# Patient Record
Sex: Male | Born: 1935 | Race: White | Hispanic: No | Smoking: Former smoker
Health system: Southern US, Community
[De-identification: ages and names within clinical notes are randomized; demographics above are authoritative.]

## PROBLEM LIST (undated history)

## (undated) DIAGNOSIS — G20 Parkinson's disease: Secondary | ICD-10-CM

## (undated) DIAGNOSIS — I10 Essential (primary) hypertension: Secondary | ICD-10-CM

## (undated) DIAGNOSIS — R482 Apraxia: Secondary | ICD-10-CM

## (undated) DIAGNOSIS — F0280 Dementia in other diseases classified elsewhere without behavioral disturbance: Secondary | ICD-10-CM

## (undated) DIAGNOSIS — G20A1 Parkinson's disease without dyskinesia, without mention of fluctuations: Secondary | ICD-10-CM

## (undated) DIAGNOSIS — G3183 Dementia with Lewy bodies: Secondary | ICD-10-CM

## (undated) DIAGNOSIS — G309 Alzheimer's disease, unspecified: Secondary | ICD-10-CM

## (undated) HISTORY — DX: Alzheimer's disease, unspecified: G30.9

## (undated) HISTORY — DX: Dementia with Lewy bodies: G31.83

## (undated) HISTORY — DX: Dementia in other diseases classified elsewhere, unspecified severity, without behavioral disturbance, psychotic disturbance, mood disturbance, and anxiety: F02.80

## (undated) HISTORY — DX: Apraxia: R48.2

---

## 2005-01-04 ENCOUNTER — Other Ambulatory Visit: Payer: Self-pay

## 2005-01-10 ENCOUNTER — Ambulatory Visit: Payer: Self-pay | Admitting: Otolaryngology

## 2005-10-22 ENCOUNTER — Ambulatory Visit: Payer: Self-pay | Admitting: Specialist

## 2006-04-09 ENCOUNTER — Other Ambulatory Visit: Payer: Self-pay

## 2006-04-09 ENCOUNTER — Ambulatory Visit: Payer: Self-pay | Admitting: Surgery

## 2006-04-16 ENCOUNTER — Ambulatory Visit: Payer: Self-pay | Admitting: Surgery

## 2007-04-07 ENCOUNTER — Ambulatory Visit: Payer: Self-pay | Admitting: Gastroenterology

## 2011-07-19 ENCOUNTER — Ambulatory Visit: Payer: Self-pay | Admitting: Internal Medicine

## 2012-06-14 DIAGNOSIS — F329 Major depressive disorder, single episode, unspecified: Secondary | ICD-10-CM | POA: Insufficient documentation

## 2012-06-14 DIAGNOSIS — R413 Other amnesia: Secondary | ICD-10-CM | POA: Insufficient documentation

## 2012-10-13 DIAGNOSIS — G250 Essential tremor: Secondary | ICD-10-CM | POA: Insufficient documentation

## 2013-11-09 ENCOUNTER — Inpatient Hospital Stay: Payer: Self-pay | Admitting: Internal Medicine

## 2013-11-09 LAB — URINALYSIS, COMPLETE
Bacteria: NONE SEEN
Glucose,UR: NEGATIVE mg/dL (ref 0–75)
Ketone: NEGATIVE
Leukocyte Esterase: NEGATIVE
Ph: 5 (ref 4.5–8.0)
Protein: 30
RBC,UR: 1 /HPF (ref 0–5)
Specific Gravity: 1.018 (ref 1.003–1.030)
Squamous Epithelial: <1
WBC UR: 2 /HPF (ref 0–5)

## 2013-11-09 LAB — MAGNESIUM: Magnesium: 1.7 mg/dL — ABNORMAL LOW

## 2013-11-09 LAB — CBC
HCT: 40.5 % (ref 40.0–52.0)
MCH: 33.2 pg (ref 26.0–34.0)
MCV: 100 fL (ref 80–100)
RBC: 4.07 10*6/uL — ABNORMAL LOW (ref 4.40–5.90)
RDW: 12.6 % (ref 11.5–14.5)
WBC: 5.5 10*3/uL (ref 3.8–10.6)

## 2013-11-09 LAB — COMPREHENSIVE METABOLIC PANEL
Alkaline Phosphatase: 68 U/L
Chloride: 104 mmol/L (ref 98–107)
Co2: 28 mmol/L (ref 21–32)
EGFR (African American): >60
EGFR (Non-African Amer.): >60
Glucose: 137 mg/dL — ABNORMAL HIGH (ref 65–99)
Osmolality: 277 (ref 275–301)
SGOT(AST): 24 U/L (ref 15–37)
SGPT (ALT): 19 U/L (ref 12–78)
Sodium: 137 mmol/L (ref 136–145)

## 2013-11-10 LAB — TSH: Thyroid Stimulating Horm: 0.199 u[IU]/mL — ABNORMAL LOW

## 2013-11-10 LAB — CBC WITH DIFFERENTIAL/PLATELET
Basophil #: 0 10*3/uL (ref 0.0–0.1)
Eosinophil #: 0 10*3/uL (ref 0.0–0.7)
Eosinophil %: 0 %
HGB: 12.3 g/dL — ABNORMAL LOW (ref 13.0–18.0)
MCH: 32.7 pg (ref 26.0–34.0)
MCHC: 33.2 g/dL (ref 32.0–36.0)
MCV: 99 fL (ref 80–100)
Monocyte #: 0.7 x10 3/mm (ref 0.2–1.0)
Neutrophil #: 6.4 10*3/uL (ref 1.4–6.5)
Neutrophil %: 77.9 %
Platelet: 102 10*3/uL — ABNORMAL LOW (ref 150–440)
RBC: 3.77 10*6/uL — ABNORMAL LOW (ref 4.40–5.90)

## 2013-11-10 LAB — COMPREHENSIVE METABOLIC PANEL
Alkaline Phosphatase: 59 U/L
BUN: 14 mg/dL (ref 7–18)
Calcium, Total: 7.9 mg/dL — ABNORMAL LOW (ref 8.5–10.1)
Chloride: 103 mmol/L (ref 98–107)
Creatinine: 0.81 mg/dL (ref 0.60–1.30)
Glucose: 145 mg/dL — ABNORMAL HIGH (ref 65–99)
Osmolality: 277 (ref 275–301)
Potassium: 3 mmol/L — ABNORMAL LOW (ref 3.5–5.1)
Sodium: 137 mmol/L (ref 136–145)
Total Protein: 6 g/dL — ABNORMAL LOW (ref 6.4–8.2)

## 2013-11-10 LAB — RAPID INFLUENZA A&B ANTIGENS

## 2013-11-10 LAB — HEMOGLOBIN A1C: Hemoglobin A1C: 6.7 % — ABNORMAL HIGH (ref 4.2–6.3)

## 2013-11-10 LAB — MAGNESIUM: Magnesium: 1.7 mg/dL — ABNORMAL LOW

## 2013-11-11 LAB — CLOSTRIDIUM DIFFICILE(ARMC)

## 2013-11-11 LAB — MAGNESIUM: Magnesium: 1.9 mg/dL

## 2013-11-12 LAB — WBCS, STOOL

## 2013-11-13 LAB — WBC: WBC: 3.4 x10 3/mm 3 — ABNORMAL LOW

## 2013-11-13 LAB — HEMOGLOBIN: HGB: 13.8 g/dL (ref 13.0–18.0)

## 2013-11-13 LAB — T4, FREE: Free Thyroxine: 1.23 ng/dL (ref 0.76–1.46)

## 2013-11-14 LAB — CBC WITH DIFFERENTIAL/PLATELET
Basophil #: 0 10*3/uL (ref 0.0–0.1)
Eosinophil #: 0 10*3/uL (ref 0.0–0.7)
Eosinophil %: 0.3 %
HGB: 13.6 g/dL (ref 13.0–18.0)
Lymphocyte #: 1.8 10*3/uL (ref 1.0–3.6)
Lymphocyte %: 39.8 %
MCH: 34.1 pg — ABNORMAL HIGH (ref 26.0–34.0)
MCHC: 34.6 g/dL (ref 32.0–36.0)
Monocyte %: 15.6 %
Neutrophil #: 2 10*3/uL (ref 1.4–6.5)
RBC: 4 10*6/uL — ABNORMAL LOW (ref 4.40–5.90)
WBC: 4.5 10*3/uL (ref 3.8–10.6)

## 2013-11-14 LAB — POTASSIUM: Potassium: 3.5 mmol/L (ref 3.5–5.1)

## 2013-11-14 LAB — CREATININE, SERUM: Creatinine: 0.67 mg/dL (ref 0.60–1.30)

## 2013-11-14 LAB — MAGNESIUM: Magnesium: 2 mg/dL

## 2013-11-14 LAB — CULTURE, BLOOD (SINGLE)

## 2014-02-10 DIAGNOSIS — Z8679 Personal history of other diseases of the circulatory system: Secondary | ICD-10-CM | POA: Insufficient documentation

## 2014-02-10 DIAGNOSIS — I10 Essential (primary) hypertension: Secondary | ICD-10-CM | POA: Insufficient documentation

## 2014-02-10 DIAGNOSIS — Z8673 Personal history of transient ischemic attack (TIA), and cerebral infarction without residual deficits: Secondary | ICD-10-CM | POA: Insufficient documentation

## 2015-02-09 ENCOUNTER — Ambulatory Visit: Payer: Medicare PPO | Attending: Neurology

## 2015-02-09 ENCOUNTER — Encounter: Payer: Self-pay | Admitting: *Deleted

## 2015-02-09 DIAGNOSIS — R293 Abnormal posture: Secondary | ICD-10-CM

## 2015-02-09 DIAGNOSIS — R482 Apraxia: Secondary | ICD-10-CM

## 2015-02-09 DIAGNOSIS — F0280 Dementia in other diseases classified elsewhere without behavioral disturbance: Secondary | ICD-10-CM | POA: Insufficient documentation

## 2015-02-09 DIAGNOSIS — R269 Unspecified abnormalities of gait and mobility: Secondary | ICD-10-CM | POA: Diagnosis not present

## 2015-02-09 DIAGNOSIS — G3183 Dementia with Lewy bodies: Secondary | ICD-10-CM

## 2015-02-09 DIAGNOSIS — G309 Alzheimer's disease, unspecified: Secondary | ICD-10-CM

## 2015-02-09 NOTE — Patient Instructions (Signed)
Educated pt and daughter on plan of care and discussed change of venue due to proximity to [facility]

## 2015-02-09 NOTE — Therapy (Signed)
[facility] [address] [place], [place], [address] Phone: [phone]   Fax:  [phone]  Physical Therapy Evaluation  Patient Details  Name: Jonathan Bean MRN: [id number] Date of Birth: [birth year] Referring Provider:  Gwendolyn Fill, PA  Encounter Date: 02/09/2015      PT End of Session - 02/09/15 1212    Visit Number 1   Number of Visits 17   Date for PT Re-Evaluation 04/07/15   Authorization Type [employer] medicare   Authorization Time Period 3/34/16-04/07/15   PT Start Time 1103   PT Stop Time 1155   PT Time Calculation (min) 52 min   Activity Tolerance Patient tolerated treatment well   Behavior During Therapy [facility] for tasks assessed/performed      Past Medical History  Diagnosis Date  . Alzheimer's dementia   . Apraxia   . Lewy body dementia     History reviewed. No pertinent past surgical history.  There were no vitals filed for this visit.  Visit Diagnosis:  Abnormality of gait - Plan: PT plan of care cert/re-cert  Posture abnormality - Plan: PT plan of care cert/re-cert  Apraxia - Plan: PT plan of care cert/re-cert      Subjective Assessment - 02/09/15 1108    Symptoms Patient presents with his daughter.  She reports he woke up on the 15th with signs of a stroke and was leaning over to left.  She reports spent few days in the bed due to exhaustion.  Saw neurologist shortly therafter and she did work up to reveal peripheral vision deficits, neuropathy right worse than left.  Been going to memory clinic at [facility] since 2012.  Reports attends Alzheimer's group at [facility] to improve safety, decrease falls and improve balance and posture.   Currently in Pain? No/denies            [facility] PT Assessment - 02/09/15 1117    Assessment   Medical Diagnosis gait disorder   Onset Date 02/08/13   Next MD Visit 02/16/15  MRI scheduled next week   Precautions   Precautions Fall   Balance  Screen   Has the patient fallen in the past 6 months Yes   How many times? >10  lot due to going to sit down and not being near a chair   [place] Private residence   Living Arrangements Children  daughter   Type of [place] to enter   Entrance Stairs-Number of Steps 3-4   Entrance Stairs-Rails Right;Left   Home Layout Two level   Alternate Level Stairs-Number of Steps flight    Alternate [place] - single point;Walker - 2 wheels;Walker - standard   Prior Function   Level of [place] Needs assistance with ADLs   Vocation Retired  owned Estate agent   Overall Cognitive Status Impaired/Different from baseline   Posture/Postural Control   Posture/Postural Control Postural limitations   Postural Limitations Rounded Shoulders;Forward head;Decreased lumbar lordosis;Posterior pelvic tilt;Right pelvic obliquity   ROM / Strength   AROM / PROM / Strength Strength   Strength   Strength Assessment Site Hip;Knee;Ankle   Right/Left Hip Right;Left   Right Hip Flexion 4-/5   Left Hip Flexion 4-/5   Right/Left Knee Right;Left   Right Knee Extension 4+/5   Left Knee Extension 4+/5   Right/Left Ankle Right;Left   Right Ankle Dorsiflexion 4+/5  Left Ankle Dorsiflexion 4+/5   Transfers   Transfers Sit to Stand;Stand to Sit   Sit to Stand Without upper extremity assist;From chair/3-in-1;5: Supervision   Sit to Stand Details (indicate cue type and reason) for safety    Stand to Sit To chair/3-in-1;With upper extremity assist   Stand to Sit Details fluctuates with reaching back and lowering with UE support or sitting with assist due to distracted in gym and sitting on arm of chair   Ambulation/Gait   Ambulation/Gait Yes   Ambulation/Gait Assistance 5: Supervision   Ambulation/Gait Assistance Details assist with wayfinding, and due to c/o feeling like floor looks raised.    Ambulation Distance (Feet) 220 Feet   Assistive device Straight cane  thick wooden stick with rubber tip and smooth polished top   Gait Pattern Lateral hip instability;Trunk rotated posteriorly on right;Trendelenburg;Lateral trunk lean to right   Ambulation Surface Level;Indoor   Gait velocity 2.37 ft/sec   Ramp 5: Supervision   Ramp Details (indicate cue type and reason) with cane, supervision for safety   Curb 5: Supervision   Curb Details (indicate cue type and reason) with cane, supervision due to uncontrolled descent from curb   Standardized Balance Assessment   Standardized Balance Assessment Berg Balance Test;Timed Up and Go Test   Berg Balance Test   Sit to Stand Able to stand  independently using hands   Standing Unsupported Able to stand safely 2 minutes   Sitting with Back Unsupported but Feet Supported on Floor or Stool Able to sit safely and securely 2 minutes   Stand to Sit Sits safely with minimal use of hands   Transfers Able to transfer safely, definite need of hands   Standing Unsupported with Eyes Closed Able to stand 10 seconds with supervision   Standing Ubsupported with Feet Together Able to place feet together independently and stand for 1 minute with supervision   From Standing, Reach Forward with Outstretched Arm Can reach confidently >25 cm (10")   From Standing Position, Pick up Object from Floor Able to pick up shoe, needs supervision   From Standing Position, Turn to Look Behind Over each Shoulder Turn sideways only but maintains balance   Turn 360 Degrees Able to turn 360 degrees safely but slowly   Standing Unsupported, Alternately Place Feet on Step/Stool Able to complete >2 steps/needs minimal assist   Standing Unsupported, One Foot in Front Able to plae foot ahead of the other independently and hold 30 seconds   Standing on One Leg Able to lift leg independently and hold 5-10 seconds   Total Score 42   Timed Up and Go Test   Normal TUG (seconds) 15.99    Manual TUG (seconds) 18.12   Cognitive TUG (seconds) 25.24                           PT Education - 02/09/15 1259    Education provided Yes   Education Details POC   Person(s) Educated Theatre stage manager)   Methods Explanation   Comprehension Verbalized understanding          PT Short Term Goals - 02/09/15 1249    PT SHORT TERM GOAL #1   Title Patient and caregiver independent in initial HEP for balance and postural strength.  03/10/15   Status New   PT SHORT TERM GOAL #2   Title Patient to ambulate 500' with cane and distant supervision for wayfinding.  03/10/15   Status New  PT SHORT TERM GOAL #3   Title Patient will demonstrate decreased fall risk with Berg score 47/56 or greater.  03/10/15   Status New   PT SHORT TERM GOAL #4   Title Patient and caregiver to verablize understanding of fall prevention techniques for home.  03/10/15   Status New           PT Long Term Goals - Feb 21, 2015 1253    PT LONG TERM GOAL #1   Title Patient and caregiver independent in HEP for balance, LE and postural strength.  04/07/15   Status New   PT LONG TERM GOAL #2   Title Patient to demonstrate decreased fall risk with TUG in 13 seconds or less.  04/07/15   Status New   PT LONG TERM GOAL #3   Title Patient to demonstrate decreased fall risk with Berg score 49/56 or greater.  04/07/15   Status New   PT LONG TERM GOAL #4   Title Daughter to report 25% decrease in falls at home.  04/07/15   Status New   PT LONG TERM GOAL #5   Title Patient and caregiver to demonstrate fall recovery technique independently.  04/07/15   Status New               Plan - 02-21-15 1213    Clinical Impression Statement Patient presents with recent diagnosis of neuropathy, visual field deficits and noted visual depth perception deficits as well as cognitive deficits all affecting safety, balance and gait.  Patient will benefit from skilled PT to address deficits and teach compensation to decrease  risk of falls.   Pt will benefit from skilled therapeutic intervention in order to improve on the following deficits Abnormal gait;Decreased balance;Decreased safety awareness;Postural dysfunction;Difficulty walking;Decreased mobility;Decreased strength   Rehab Potential Good   PT Frequency 2x / week   PT Duration 8 weeks   PT Treatment/Interventions ADLs/Self Care Home Management;Therapeutic activities;Patient/family education;Therapeutic exercise;DME Instruction;Functional mobility training;Neuromuscular re-education;Stair training;Balance training;Gait training   PT Next Visit Plan Consider initiate [place] HEP and postural strengthening   Consulted and Agree with Plan of Care Patient;Family member/caregiver   Family Member Consulted daughter          G-Codes - 02/21/2015 1257    Functional Assessment Tool Used Merrilee Jansky, TUG, falling frequency   Functional Limitation Mobility: Walking and moving around   Mobility: Walking and Moving Around Current Status [phone]) At least 60 percent but less than 80 percent impaired, limited or restricted   Mobility: Walking and Moving Around Goal Status [phone]) At least 40 percent but less than 60 percent impaired, limited or restricted       Problem List Patient Active Problem List   Diagnosis Date Noted  . Alzheimer's disease 2015-02-21  . Lewy body dementia 02-21-15  . Apraxia 2015/02/21    Mclane Arora,CYNDI 02-21-2015, 1:03 PM  [facility], [facility] [address] [place], [place], [address] Phone: [phone]   Fax:  [phone]

## 2015-03-07 ENCOUNTER — Encounter
Admit: 2015-03-07 | Disposition: A | Discharge status: Home / Self Care | Payer: Self-pay | Attending: Family Medicine | Admitting: Family Medicine

## 2015-03-10 NOTE — H&P (Signed)
PATIENT NAME:  Jonathan Bean, Jonathan Bean MR#:  [id number] DATE OF BIRTH:  [birth year]  DATE OF ADMISSION:  11/09/2013  PRIMARY CARE PHYSICIAN:  Derinda Late, MD  CHIEF COMPLAINT:  Rigors, weakness.   HISTORY OF PRESENTING ILLNESS: An 81 year old Caucasian male patient with a history of cutaneous T cell lymphoma, Alzheimer's, TIA, presents to the hospital with his daughter with complaints of rigors, fever and generalized weakness. The patient is not contributing to history at this time. He is trying to talk but unable to talk but does follow commands. History is obtained from daughter. The patient was initially taken to urgent care but since the waiting line was too long, the daughter decided to bring the patient here to the Emergency Room. The patient has had on and off diarrhea of large watery bowel movement of about 1 episode every other day or once a week for about 3 months. This was seen by his primary care physician who has started him on Imodium. Over the past 1 day, the patient has been more weak. Today, he had coarse tremors over his familial tremors, unable to hold onto the spoon. He was trying to get out of the chair and go to the bathroom and collapsed, did not loose consciousness, has been confused. The patient had a temperature of 101.5 at home, 103 with EMS and 102.5 here in the Emergency Room. The patient was also hypoxic to 87% on room air while Dr. Joni Fears of Emergency Room was in with the patient. Presently, he is saturating 91% on 2 liters oxygen. His chest x-ray seems to show an early infiltrate in the right base. The patient has been coughing since yesterday. He has not received a flu shot. The daughter mentions that the patient and her have similar immunity system and tend to get flu whenever they get a flu shot. This is the reason the patient does not get a flu shot. He does have significant Alzheimer's dementia per history with some gait abnormalities although he tends to walk on his own  without a walker or cane.   The patient had an episode of TIA with eye blindness which was temporary for which he saw his doctor and was started on a baby aspirin. He has followed up with [facility] in the past for TIAs in the past per daughter, although they started him on an Exelon patch and not on any other treatments. He had multiple MRIs.   PAST MEDICAL HISTORY: 1.  Cutaneous T-cell lymphoma.  2.  Alzheimer's.  3.  Knee surgery.  4.  Familial tremor.  5.  Gait abnormalities.  6.  Chronic diarrhea of 3 months.  7.  TIA.   8.  Diet-controlled diabetes mellitus.   SOCIAL HISTORY: The patient lives at home with his daughter, ambulates on his own. Smoked in the past, but quit 3 years back. No alcohol. No illicit drugs.   CODE STATUS:  Full code.    FAMILY HISTORY: Reviewed but unknown by family. The patient is nonverbal at this time.   REVIEW OF SYSTEMS: Unobtainable as the patient is not answering any questions although he tries to speak.   HOME MEDICATIONS: Include:  1.  Aspirin 81 mg oral once a day.  2.  Exelon patch once a day.   PHYSICAL EXAMINATION: VITAL SIGNS: Temperature 102.8, pulse of 79, respirations 22, saturating 87% on room air, 91% on 2 liters oxygen. Blood pressure 144/82.  GENERAL: Obese, Caucasian male patient lying in bed with fine tremors. Follows  commands but seems confused, restless, drowsy.  HEENT: Atraumatic, normocephalic. Oral mucosa dry and pink. No pallor or icterus. Pupils bilaterally equal and reactive to light.  NECK: Supple. No thyromegaly or palpable lymph nodes. Trachea midline. No carotid bruit, JVD.  CARDIOVASCULAR: S1, S2, without any murmurs. Peripheral pulses 2+.  RESPIRATORY: Normal work of breathing. Occasional coughing. Has crackles in the right lower lobe.  GASTROINTESTINAL: Soft abdomen, nontender. Bowel sounds present. No hepatosplenomegaly palpable.  GENITOURINARY: No CVA tenderness or bladder distention.  SKIN: Warm and dry. No  petechiae, rash, ulcers.  MUSCULOSKELETAL: No joint swelling, redness, effusion of the large joints. Normal muscle tone.  NEUROLOGICAL: Moves all 4 extremities symmetrically. Babinski is downgoing. Reflexes 2+.   LABORATORY, DIAGNOSTIC AND RADIOLOGICAL STUDIES: Show:  1.  A BUN of 15, creatinine 0.82, sodium 137, potassium 4, chloride 104, glucose of 137. AST, ALT, alkaline phosphatase, bilirubin normal.  2.  WBC 5.9, hemoglobin 13.5, platelets of 124.  3.  Urinalysis showing no bacteria, 2 WBC.  4.  ABG shows pH of 7.45 with pCO2 of 38.  5.  Lactic acid of 0.7  6.  EKG shows normal sinus rhythm, LVH, first-degree block with incomplete right bundle branch block.  7.  Chest x-ray shows early infiltrate in the right lower lobe on personal review.   ASSESSMENT AND PLAN: 1.  Right lower lobe pneumonia with encephalopathy and generalized weakness. We will start the patient on Levaquin IV along with IV fluids, nebulizers p.r.n. and get sputum, blood cultures. We will also check an influenza A and B as patient did not get an influenza vaccine. Will get a CT scan of the head for his encephalopathy. The patient will be on fall precautions, oxygen to keep his sats over 90%.  2.  Diarrhea. The patient has had on and off for diarrhea, just 1 episode every few days. I do not suspect infection with this but will get stool WBCs and rule out Clostridium difficile infection, although this seems like there could be some competent of malabsorption with this patient's symptoms. His home medication list is not available at this time but he is not on metformin or any iron pills.  3.  Alzheimer's dementia. The patient is high risk for inpatient delirium with his acute illness. Fall precautions, Ativan p.r.n.  4.  Mild thrombocytopenia without any bleeding, baseline unknown. Could be secondary to his acute illness. Will repeat in the morning.  5.  Deep vein thrombosis prophylaxis with Lovenox.  6.  CODE STATUS: Full  code.   TIMES SPENT TODAY ON THIS CASE: Was 2 minutes.   ____________________________ Leia Alf Tinlee Navarrette, MD srs:cs D: 11/09/2013 19:06:32 ET T: 11/09/2013 19:41:09 ET JOB#: [id number]  cc: Alveta Heimlich R. Darvin Neighbours, MD, <Dictator> Derinda Late, MD Neita Carp MD ELECTRONICALLY SIGNED 11/10/2013 14:23

## 2015-03-11 NOTE — Discharge Summary (Signed)
PATIENT NAME:  Jonathan Bean, ERICSSON MR#:  [id number] DATE OF BIRTH:  1926/08/20  DATE OF ADMISSION:  11/09/2013 DATE OF DISCHARGE:  11/16/2013  ADMITTING DIAGNOSIS: Sepsis.   DISCHARGE DIAGNOSES:  1.  Systemic inflammatory response syndrome. 2.  Acute bronchitis.  3.  Diarrhea, resolved.  4.  Thrombocytopenia.  5.  Hypokalemia. using alcohol use 6.  Hypomagnesemia.  7.  Congestive heart failure, acute diastolic.  8.  Hypertension.  9.  Diabetes mellitus with hemoglobin A1c of 6.7.  10.  Alzheimer-type dementia.  11.  Essential tremor.   DISCHARGE CONDITION: Stable.   DISCHARGE MEDICATIONS: The patient is to continue:  1.  Exelon patch 9.5 mg transdermal patch once daily.  2.  Aspirin 81 mg per daily.  3.  Propranolol 10 mg p.o. 3 times daily.  4.  Lisinopril 2.5 mg p.o. once daily at bedtime.  5.  Primidone 50 mg orally twice daily. 6.  Pantoprazole 40 mg p.o. daily.  7.  Levaquin 750 mg every 48 hours for 2 more pills, to complete a 7 day course.  8.  Oxygen -none .   Home health services will be prescribed for him upon discharge, physical therapy, as well as nurse.   HOME OXYGEN: None.   DIET: Two-gram salt, low fat, low cholesterol, mechanical soft, to be advanced to regular as tolerated.   ACTIVITY LIMITATIONS: As tolerated.   Followup appointment with [place] [facility] in 2 days after discharge.    CONSULTANTS: Care management, social work.   RADIOLOGIC STUDIES: Chest x-ray, portable, 11/09/2013, revealing borderline cardiomegaly, but no active disease. CT of the head without contrast, 11/09/2013, revealed probable bilateral frontal, as well as ethmoid sinusitis, mild diffuse cortical atrophy. No acute intracranial abnormality noted. Chest, PA and lateral, 11/10/2013, revealed appearance of the lungs may reflect low-grade interstitial edema, which may be of cardiac or noncardiac cause. There is no focal pneumonia or significant pleural effusion. There are loops of mildly  distended gas-filled small bowel in the upper abdomen. It may be useful to consider the patient for an acute abdominal series, according to the radiologist. Abdominal x-ray, flat and erect, 11/13/2013, revealed bowel gas pattern, which was unremarkable. Repeated chest x-ray, PA and lateral, 11/15/2013, showed no evidence of pneumonia or CHF or definite evidence of other cardiopulmonary abnormality. There is likely underlying COPD.  Echocardiogram, 11/15/2013, revealed left ventricular ejection fraction by visual estimation of 50% to 55%, low normal global left ventricular systolic function, borderline left ventricular hypertrophy, decreased left ventricular internal cavity size, mild mitral valve regurgitation, mild tricuspid regurgitation.  The patient is an 81 year old Caucasian male with past medical history significant for history of cutaneous T-cell lymphoma, history of Alzheimer dementia and TIA, who presents to the hospital with complaints of rigors as well as weakness. Please refer to the Dr. Boykin Reaper admission note on 11/09/2013. On arrival to the hospital, the patient's temperature was 102.8, pulse was 79, respiration rate was 22. O2 sats were 87% on room air. The patient's O2 sats, however, improved to 91% on 2 liters of oxygen through nasal cannula. Blood pressure was normal at 144/82. Physical exam was unremarkable. The patient's lab data done in the Emergency Room on the day of admission, 11/09/2013, revealed normal BMP, except for mild elevation of glucose to 137. Calcium level was 8.2. Magnesium level was low at 1.7. Liver enzymes were normal. The patient's CBC, white blood cell count was 5.5, hemoglobin was 13.5, platelet count was 124. Blood cultures taken on the same day,  11/09/2013, did not show any growth. Urine culture also showed no growth; mixed bacterial organisms, results suggestive of contamination. Influenza test was negative. The patient's stool culture was also negative and C. diff  was also negative. The patient was admitted to the hospital for further evaluation. He was started on broad-spectrum antibiotic therapy and his cultures were continued to observe. Repeated chest x-rays revealed some mild atelectasis, and changes consistent with possible congestive heart failure. It was felt that the patient's condition could have been related to interstitial pneumonitis; hence, his antibiotic was changed to Levaquin. The patient is to continue Levaquin for the next few days after discharge to complete a 7 day course. We attempted to obtain sputum culture as well. The patient had only dry cough, which actually subsided as time progressed, and we were not able to get any. His oxygenation, which was abnormal initially on arrival to the hospital, normalized as time progressed. On the day of discharge, 11/16/2013,  the patient's O2 sats were 93% to 95% on room air at rest. It is recommended to follow the patient's oxygen saturations and make decisions about further investigation if needed. His fevers with antibiotic therapy have subsided. The patient was not noted to have hypokalemia while in the hospital, as well as hypomagnesemia. Those microelements were supplemented orally, as well as IV. In regards to thrombocytopenia, which was noted on the day of admission, the patient's white cell count was followed very closely and did remain stable and improved. A few days before discharge, 11/14/2013, the patient's white blood cell count remained stable at 4.5. Hemoglobin has remained stable at 13.6 and platelet count was 128. It was felt that the patient's low platelet count was very likely baseline. No further interventions were needed; however, it is recommended to follow the patient's platelet count and make decisions about evaluation by oncologist, hematologist if needed. In his regards to his  Alzheimer-type dementia, the patient is to continue his outpatient management. For essential tremor, his  medications were resumed and his essential tremor resolved. As the patient's chest x-ray was concerning for possible congestive heart failure, we also had echocardiogram done while the patient was in the hospital, which revealed normal ejection fraction; however, as the patient's blood pressure was noted to be elevated, the patient was initiated on very low dose of lisinopril at 2.5 mg p.o. at bedtime. On the day of discharge, the patient's vital signs are stable with temperature of 97.1, pulse 76, respiratory rate was 20, blood pressure 111/71, saturation 93% to 95% on room air at rest. Of note, encephalopathy, which was noted on admission, resolved with  resolution of the patient's fevers.    ____________________________ Theodoro Grist, MD rv:dmm D: 11/16/2013 17:14:00 ET T: 11/16/2013 22:16:15 ET JOB#: [id number]  cc: [facility] Theodoro Grist, MD, <Dictator> Derinda Late, MD [place] MD ELECTRONICALLY SIGNED 11/29/2013 15:16

## 2015-03-20 ENCOUNTER — Ambulatory Visit: Payer: Medicare PPO | Attending: Family Medicine | Admitting: Physical Therapy

## 2015-03-20 ENCOUNTER — Encounter: Payer: Self-pay | Admitting: Physical Therapy

## 2015-03-20 VITALS — BP 149/68

## 2015-03-20 DIAGNOSIS — R482 Apraxia: Secondary | ICD-10-CM

## 2015-03-20 DIAGNOSIS — R262 Difficulty in walking, not elsewhere classified: Secondary | ICD-10-CM | POA: Diagnosis not present

## 2015-03-20 DIAGNOSIS — R269 Unspecified abnormalities of gait and mobility: Secondary | ICD-10-CM | POA: Insufficient documentation

## 2015-03-20 DIAGNOSIS — R2681 Unsteadiness on feet: Secondary | ICD-10-CM | POA: Insufficient documentation

## 2015-03-20 DIAGNOSIS — R293 Abnormal posture: Secondary | ICD-10-CM

## 2015-03-20 NOTE — Therapy (Signed)
[place] MAIN [facility] SERVICES [address] [place], [place], [address] Phone: [phone]   Fax:  [phone]  Physical Therapy Treatment  Patient Details  Name: Jonathan Bean MRN: [id number] Date of Birth: February 21, 1926 Referring Provider:  Derinda Late, MD  Encounter Date: 03/20/2015      PT End of Session - 03/20/15 1324    Visit Number 5   Number of Visits 17   Date for PT Re-Evaluation 04/07/15   Authorization Type [employer] medicare   Authorization Time Period 3/34/16-04/07/15   PT Start Time 1305   PT Stop Time 1343   PT Time Calculation (min) 38 min   Equipment Utilized During Treatment Gait belt   Activity Tolerance Other (comment)  had increased difficulty with negotiating obstacles with increased fatigue   Behavior During Therapy [facility] for tasks assessed/performed      Past Medical History  Diagnosis Date  . Alzheimer's dementia   . Apraxia   . Lewy body dementia     History reviewed. No pertinent past surgical history.  Filed Vitals:   03/20/15 1325  BP: 149/68    Visit Diagnosis:  Abnormality of gait  Posture abnormality  Apraxia      Subjective Assessment - 03/20/15 1325    Subjective Patient reports feeing dizzy today; he reports eating a late breakfast prior to treatment; reports decreased compliance with HEP only 1x over last week but son reports that patient has been walking a lot at home. denies any new falls.   Currently in Pain? Yes   Pain Score 7    Pain Location Knee  bilateral       Treatment: Instructed patient in resisted weighted gait exercise, plate #5, 2 way (forward/backward and side step to left and eccentric to right) x3 laps each with mod A;  Modifed tandem stance on dynadisc in parallel bars without rail assist: 10 sec hold, with head turns up/down x5 side/side x5 with each foot in front;  Weaving around 8 cones x4 reps with max VCs and min A for safety; Patient required max Vcs  to increase scan of environment; he did miss 2-3 cones with first lap but by last lap was able to negotiate cones without missing them. Patient required min-moderate verbal/tactile cues for correct exercise technique. Patient required min VCs for balance stability, including to increase trunk control for less loss of balance with smaller base of support                           PT Education - 03/20/15 1358    Education provided Yes   Education Details instructed patient in safety awareness with scanning environment to avoid bumping into objects   Person(s) Educated Patient;Child(ren)   Methods Explanation;Demonstration   Comprehension Verbal cues required;Tactile cues required          PT Short Term Goals - 02/09/15 1249    PT SHORT TERM GOAL #1   Title Patient and caregiver independent in initial HEP for balance and postural strength.  03/10/15   Status New   PT SHORT TERM GOAL #2   Title Patient to ambulate 500' with cane and distant supervision for wayfinding.  03/10/15   Status New   PT SHORT TERM GOAL #3   Title Patient will demonstrate decreased fall risk with Berg score 47/56 or greater.  03/10/15   Status New   PT SHORT TERM GOAL #4   Title Patient and  caregiver to verablize understanding of fall prevention techniques for home.  03/10/15   Status New           PT Long Term Goals - 02/09/15 1253    PT LONG TERM GOAL #1   Title Patient and caregiver independent in HEP for balance, LE and postural strength.  04/07/15   Status New   PT LONG TERM GOAL #2   Title Patient to demonstrate decreased fall risk with TUG in 13 seconds or less.  04/07/15   Status New   PT LONG TERM GOAL #3   Title Patient to demonstrate decreased fall risk with Berg score 49/56 or greater.  04/07/15   Status New   PT LONG TERM GOAL #4   Title Daughter to report 25% decrease in falls at home.  04/07/15   Status New   PT LONG TERM GOAL #5   Title Patient and caregiver to  demonstrate fall recovery technique independently.  04/07/15   Status New               Plan - 03/20/15 1401    Clinical Impression Statement Patient demonstrates improved static standing balance being able to stand on uneven surface without HHA with only min A for balance control; however he did have increased difficulty negotiating obstacles requiring max VCs and min A for safety and scanning environment for better awareness of cones for less loss of balance. he would benefit from additional skiled PT intervention to improve balance/gait safety and reduce fall risk.    Pt will benefit from skilled therapeutic intervention in order to improve on the following deficits Abnormal gait;Decreased balance;Decreased safety awareness;Postural dysfunction;Difficulty walking;Decreased mobility;Decreased strength   Rehab Potential Good   PT Frequency 2x / week   PT Duration 8 weeks   PT Next Visit Plan Assess goals, update G-codes and discuss plan of care; work on negotiating obstacles   Consulted and Agree with Plan of Care Patient;Family member/caregiver   Family Member Consulted son        Problem List Patient Active Problem List   Diagnosis Date Noted  . Alzheimer's disease 02/09/2015  . Lewy body dementia 02/09/2015  . Apraxia 02/09/2015    Alessandra Grout, PT, DPT 03/20/2015 2:15 PM   [place] MAIN [facility] SERVICES [address] [place], [place], [address] Phone: [phone]   Fax:  [phone]

## 2015-03-22 ENCOUNTER — Encounter: Payer: Self-pay | Admitting: Physical Therapy

## 2015-03-22 ENCOUNTER — Ambulatory Visit: Payer: Medicare PPO | Admitting: Physical Therapy

## 2015-03-22 DIAGNOSIS — R269 Unspecified abnormalities of gait and mobility: Secondary | ICD-10-CM

## 2015-03-22 DIAGNOSIS — R482 Apraxia: Secondary | ICD-10-CM

## 2015-03-22 NOTE — Therapy (Signed)
[place] MAIN [facility] SERVICES [address] [place], [place], [address] Phone: [phone]   Fax:  [phone]  Physical Therapy Treatment  Patient Details  Name: Jonathan Bean MRN: [id number] Date of Birth: May 07, 1926 Referring Provider:  Derinda Late, MD  Encounter Date: 03/22/2015      PT End of Session - 03/22/15 1411    Visit Number 7   Number of Visits 17   Date for PT Re-Evaluation 04/07/15   Authorization Type [employer] medicare   Authorization Time Period 3/34/16-04/07/15   PT Start Time 1303   PT Stop Time 1400   PT Time Calculation (min) 57 min   Equipment Utilized During Treatment Gait belt   Activity Tolerance Patient tolerated treatment well   Behavior During Therapy [facility] for tasks assessed/performed      Past Medical History  Diagnosis Date  . Alzheimer's dementia   . Apraxia   . Lewy body dementia     History reviewed. No pertinent past surgical history.  There were no vitals filed for this visit.  Visit Diagnosis:  Abnormality of gait  Apraxia      Subjective Assessment - 03/22/15 1308    Subjective Patient presents to therapy without cane; daughter reports that he didn't feel that he needed the cane with the steps and feels as though he might be doing better. Patient denies any dizziness today; reports no pain right now;    Patient is accompained by: Family member  daughter   Currently in Pain? No/denies            [facility] PT Assessment - 03/22/15 0001    6 Minute Walk- Baseline   6 Minute Walk- Baseline yes  1050 feet;    Berg Balance Test   Sit to Stand Able to stand without using hands and stabilize independently   Standing Unsupported Able to stand safely 2 minutes   Sitting with Back Unsupported but Feet Supported on Floor or Stool Able to sit safely and securely 2 minutes   Stand to Sit Sits safely with minimal use of hands   Transfers Able to transfer safely, minor use of hands   Standing Unsupported with Eyes Closed Able to stand 10 seconds safely   Standing Ubsupported with Feet Together Able to place feet together independently and stand 1 minute safely   From Standing, Reach Forward with Outstretched Arm Can reach forward >12 cm safely (5")   From Standing Position, Pick up Object from Floor Able to pick up shoe safely and easily   From Standing Position, Turn to Look Behind Over each Shoulder Looks behind from both sides and weight shifts well   Turn 360 Degrees Able to turn 360 degrees safely but slowly   Standing Unsupported, Alternately Place Feet on Step/Stool Able to stand independently and complete 8 steps >20 seconds   Standing Unsupported, One Foot in Front Able to take small step independently and hold 30 seconds   Standing on One Leg Tries to lift leg/unable to hold 3 seconds but remains standing independently   Total Score 47   Timed Up and Go Test   Normal TUG (seconds) 12         Treatment: Warm up on Nustep level 5 x4 min (unbilled)  Advanced HEP with instruction in standing tband exercise including: Hip abduction green tband x10 bilaterally; Hip extension green tband x10 bilaterally;  Sit<>Stand x10 without HHA, x5 with pillow in chair without HHA;  Patient required min-moderate verbal/tactile  cues for correct exercise technique including to utilize kitchen counter for safety and to improve posture with standing exercise for increased hip strengthening.                    PT Education - 2015-04-02 1411    Education provided Yes   Education Details HEP provided   [employer]) Educated Patient;Child(ren)   Methods Explanation;Demonstration   Comprehension Verbalized understanding;Returned demonstration          PT Short Term Goals - Apr 02, 2015 1337    PT SHORT TERM GOAL #1   Title Patient and caregiver independent in initial HEP for balance and postural strength.  03/10/15   Status Partially Met   PT SHORT TERM GOAL #2    Title Patient to ambulate 500' with cane and distant supervision for wayfinding.  03/10/15   Status Partially Met   PT SHORT TERM GOAL #3   Title Patient will demonstrate decreased fall risk with Berg score 47/56 or greater.  03/10/15   Status Achieved   PT SHORT TERM GOAL #4   Title Patient and caregiver to verablize understanding of fall prevention techniques for home.  03/10/15   Status Partially Met           PT Long Term Goals - 04-02-15 1338    PT LONG TERM GOAL #1   Title Patient and caregiver independent in HEP for balance, LE and postural strength.  04/07/15   Status Partially Met   PT LONG TERM GOAL #2   Title Patient to demonstrate decreased fall risk with TUG in 13 seconds or less.  04/07/15   Status Achieved   PT LONG TERM GOAL #3   Title Patient to demonstrate decreased fall risk with Berg score 49/56 or greater.  04/07/15   Status Not Met   PT LONG TERM GOAL #4   Title Daughter to report 25% decrease in falls at home.  04/07/15   Status Achieved   PT LONG TERM GOAL #5   Title Patient and caregiver to demonstrate fall recovery technique independently.  04/07/15   Status Not Met   Additional Long Term Goals   Additional Long Term Goals Yes               Plan - Apr 02, 2015 1413    Clinical Impression Statement Patient demonstrates significant improvement in Berg Balance assessment and timed up and go; He did require CGA during 6 min walk and demonstrates increased forward weight shift and decreased DF with increased toe drag with additional fatigue towards end of 6 min walk test. Patient would benefit from additional skilled PT intervention to improve balance/gait safety and improve LE strength.   Pt will benefit from skilled therapeutic intervention in order to improve on the following deficits Abnormal gait;Decreased balance;Decreased safety awareness;Postural dysfunction;Difficulty walking;Decreased mobility;Decreased strength   Rehab Potential Good   PT Frequency 2x  / week   PT Duration 8 weeks   PT Next Visit Plan Instruct patient in tband strengthening for postural control   Consulted and Agree with Plan of Care Patient;Family member/caregiver   Family Member Consulted daughter          G-Codes - 2015-04-02 1416    Functional Assessment Tool Used Merrilee Jansky, TUG, falling frequency   Functional Limitation Mobility: Walking and moving around   Mobility: Walking and Moving Around Current Status [phone]) At least 40 percent but less than 60 percent impaired, limited or restricted   Mobility: Walking and Moving Around Goal Status (  [id number]) At least 20 percent but less than 40 percent impaired, limited or restricted      Problem List Patient Active Problem List   Diagnosis Date Noted  . Alzheimer's disease 02/09/2015  . Lewy body dementia 02/09/2015  . Apraxia 02/09/2015    Alessandra Grout, PT, DPT, [phone] 03/22/2015 2:20 PM   [place] MAIN [facility] SERVICES [address] [place], [place], [address] Phone: [phone]   Fax:  [phone]

## 2015-03-22 NOTE — Patient Instructions (Addendum)
  EXTENSION: Standing (Active)   Stand with green band tied around both legs down by the ankle. Kick one leg backwards keeping knee straight (hold onto kitchen counter for balance) Complete _2__ sets of _10__ repetitions. Perform __1_ sessions per day.  http://gtsc.exer.us/76   Copyright  VHI. All rights reserved.  ABDUCTION: Standing - Resistance Band (Active)   Stand, feet flat.Tie band around both legs down by the ankle. (Hold onto kitchen counter) Complete __2_ sets of _10__ repetitions. Perform ___ sessions per day.  http://gtsc.exer.us/116   Copyright  VHI. All rights reserved.  SIT TO STAND: No Device   Sit with feet shoulder-width apart, on floor.(Make sure that you are in a chair that won't move like a chair against a wall or couch etc) Lean chest forward, raise hips up from surface. Straighten hips and knees. Weight bear equally on left and right sides. *You could put a pillow in the seat to make the chair a little higher to reduce knee discomfort. 10___ reps per set, _2__ sets per day, _5__ days per week Place left leg closer to sitting surface.

## 2015-03-27 ENCOUNTER — Encounter: Payer: Self-pay | Admitting: Physical Therapy

## 2015-03-27 ENCOUNTER — Ambulatory Visit: Payer: Medicare PPO | Admitting: Physical Therapy

## 2015-03-27 DIAGNOSIS — R293 Abnormal posture: Secondary | ICD-10-CM

## 2015-03-27 DIAGNOSIS — R269 Unspecified abnormalities of gait and mobility: Secondary | ICD-10-CM | POA: Diagnosis not present

## 2015-03-27 DIAGNOSIS — R482 Apraxia: Secondary | ICD-10-CM

## 2015-03-27 NOTE — Patient Instructions (Signed)
Patient re-instructed in HEP provided from last week.

## 2015-03-27 NOTE — Therapy (Signed)
[place] MAIN [facility] SERVICES [address] [place], [place], [address] Phone: [phone]   Fax:  [phone]  Physical Therapy Treatment  Patient Details  Name: Jonathan Bean MRN: [id number] Date of Birth: 06-Feb-1926 Referring Provider:  No ref. provider found  Encounter Date: 03/27/2015      PT End of Session - 03/27/15 1651    Visit Number 8   Number of Visits 17   Date for PT Re-Evaluation 04/07/15   PT Start Time 9381   PT Stop Time 1520   PT Time Calculation (min) 42 min   Equipment Utilized During Treatment Gait belt   Activity Tolerance Patient tolerated treatment well   Behavior During Therapy [facility] for tasks assessed/performed  Memory and cognitive deficits, patient required multiple bouts of cuing to complete multi-step tasks.       Past Medical History  Diagnosis Date  . Alzheimer's dementia   . Apraxia   . Lewy body dementia     History reviewed. No pertinent past surgical history.  There were no vitals filed for this visit.  Visit Diagnosis:  Abnormality of gait  Apraxia  Posture abnormality      Subjective Assessment - 03/27/15 1445    Subjective Patient is not using his cane today, and denies dizziness initially. Patient was unable to recall any of the exercises he was given for his HEP.    Patient is accompained by: Family member   Pertinent History Alzheimer's dementia    Patient Stated Goals To improve balance.    Currently in Pain? Yes   Pain Score 5    Pain Location Knee   Pain Orientation Right   Pain Onset More than a month ago   Multiple Pain Sites Yes   Pain Score 5   Pain Location Knee   Pain Orientation Left   Pain Type Chronic pain   Pain Onset More than a month ago     (For all exercises, patient required constant cuing for technique and reminders as patient has short term memory loss.)  Gait training Gait with eyes closed laterally x 10' for 2 sets Gait backwards with cuing for  increased stride length x 20' for 2 sets.  Ambulation with theraband in pull-apart exercise to promote erect posture in gait x 15' for 2 sets with cuing for technique.  Tandem stance x 25' with cuing for heel to toe ambulation.    Neuromuscular Re-education Mid rows with red theraband with significant cuing for technique (not having hands drop) and maintaining erect posture for scapular retraction. X 10 repetitions for 2 sets  Theraband pull aparts with red band with cuing to pull band apart x 10 repetitions for 2 sets.  Step ups to LLE and 6" step x 10 repetitions bilaterally  Tandem stance with 1 foot on blue foam, and one foot on solid surface x 30 seconds  Tandem stance with 2 feet on blue foam bilaterally 2 repetitions x 30 seconds. Cuing to shift weight anteriorly and look in mirror for lateral trunk displacement to left, and shift weight to the right.  Sit to stand x 10 for 2 sets with cuing for decreased trunk anterior displacement to decrease patellofemoral compression.  Hip abduction/extension bilaterally with red-theraband x 10 bilaterally with cuing for technique of straight abduction in standing.                            PT Education -  03/27/15 1650    Education provided Yes   Education Details HEP re-instructed.    Person(s) Educated Patient;Child(ren)   Methods Explanation;Demonstration;Verbal cues;Tactile cues   Comprehension Verbalized understanding;Verbal cues required;Tactile cues required;Need further instruction;Returned demonstration  Patient displays short term memory deficits and requires further instruction.           PT Short Term Goals - 03/27/15 1653    PT SHORT TERM GOAL #1   Title Patient and caregiver independent in initial HEP for balance and postural strength.  03/10/15   Status Partially Met   PT SHORT TERM GOAL #2   Title Patient to ambulate 500' with cane and distant supervision for wayfinding.  03/10/15   Status Partially Met    PT SHORT TERM GOAL #3   Title Patient will demonstrate decreased fall risk with Berg score 47/56 or greater.  03/10/15   Status Achieved   PT SHORT TERM GOAL #4   Title Patient and caregiver to verablize understanding of fall prevention techniques for home.  03/10/15   Status Partially Met           PT Long Term Goals - 03/27/15 1653    PT LONG TERM GOAL #1   Title Patient and caregiver independent in HEP for balance, LE and postural strength.  04/07/15   Status Partially Met   PT LONG TERM GOAL #2   Title Patient to demonstrate decreased fall risk with TUG in 13 seconds or less.  04/07/15   Status Achieved   PT LONG TERM GOAL #3   Title Patient to demonstrate decreased fall risk with Berg score 49/56 or greater.  04/07/15   Status Not Met   PT LONG TERM GOAL #4   Title Daughter to report 25% decrease in falls at home.  04/07/15   Status Achieved   PT LONG TERM GOAL #5   Title Patient and caregiver to demonstrate fall recovery technique independently.  04/07/15   Status Not Met               Plan - 03/27/15 1652    Clinical Impression Statement Patient demonstrates improved balance with higher level balance activities, but continues to have significant left sided trunk lean. It appears this has been chronic since his stroke. Patient responded well to visual cues looking in the mirror to correct flexed trunk posture and become more erect. Pt experiences pain and popping initially with squatting, which improved as he was cued to keep a more vertical trunk orientation to offload patellofemoral joint. Skilled PT services are indicated to continue to address the above deficits.    Pt will benefit from skilled therapeutic intervention in order to improve on the following deficits Abnormal gait;Decreased balance;Decreased safety awareness;Postural dysfunction;Difficulty walking;Decreased mobility;Decreased strength   Rehab Potential Good   Clinical Impairments Affecting Rehab Potential  Alzheimer's dementia and decreased ability to independently complete HEP.    PT Frequency 2x / week   PT Duration 8 weeks   PT Treatment/Interventions ADLs/Self Care Home Management;Therapeutic activities;Patient/family education;Therapeutic exercise;DME Instruction;Functional mobility training;Neuromuscular re-education;Stair training;Balance training;Gait training   PT Next Visit Plan Re-instructed patient in HEP.    Consulted and Agree with Plan of Care Patient;Family member/caregiver   Family Member Consulted daughter        Problem List Patient Active Problem List   Diagnosis Date Noted  . Alzheimer's disease 02/09/2015  . Lewy body dementia 02/09/2015  . Apraxia 02/09/2015   Kerman Passey, PT, DPT    03/27/2015, 5:00 PM  [place] MAIN [facility] SERVICES [address] [place], [place], [address] Phone: [phone]   Fax:  [phone]

## 2015-03-29 ENCOUNTER — Ambulatory Visit: Payer: Medicare PPO | Admitting: Physical Therapy

## 2015-03-31 ENCOUNTER — Encounter: Payer: Self-pay | Admitting: Physical Therapy

## 2015-03-31 ENCOUNTER — Ambulatory Visit: Payer: Medicare PPO | Admitting: Physical Therapy

## 2015-03-31 DIAGNOSIS — R269 Unspecified abnormalities of gait and mobility: Secondary | ICD-10-CM | POA: Diagnosis not present

## 2015-03-31 DIAGNOSIS — R482 Apraxia: Secondary | ICD-10-CM

## 2015-03-31 DIAGNOSIS — R293 Abnormal posture: Secondary | ICD-10-CM

## 2015-03-31 NOTE — Therapy (Signed)
[place] MAIN [facility] SERVICES [address] [place], [place], [address] Phone: [phone]   Fax:  [phone]  Physical Therapy Treatment  Patient Details  Name: Jonathan Bean MRN: [id number] Date of Birth: 05/10/1926 Referring Provider:  Derinda Late, MD  Encounter Date: 03/31/2015      PT End of Session - 03/31/15 1539    Visit Number 9   Number of Visits 17   Date for PT Re-Evaluation 04/07/15   Authorization Type [employer] medicare   Authorization Time Period 3/34/16-04/07/15   PT Start Time 1439   PT Stop Time 1525   PT Time Calculation (min) 46 min   Equipment Utilized During Treatment Gait belt   Activity Tolerance Patient tolerated treatment well   Behavior During Therapy [facility] for tasks assessed/performed      Past Medical History  Diagnosis Date  . Alzheimer's dementia   . Apraxia   . Lewy body dementia     History reviewed. No pertinent past surgical history.  There were no vitals filed for this visit.  Visit Diagnosis:  Abnormality of gait  Apraxia  Posture abnormality      Subjective Assessment - 03/31/15 1441    Subjective Per patient's daughter patient was walking around 2 nights ago without his cane and experienced a fall. Patient wa sunable to recall any of his HEP, daughter asked if they needed to change anything to it.    Patient is accompained by: Family member   Pertinent History Alzheimer's dementia    Limitations Walking   Patient Stated Goals To improve balance.    Currently in Pain? Yes   Pain Score 4    Pain Location Knee   Pain Orientation Right   Pain Onset More than a month ago   Multiple Pain Sites Yes   Pain Score 4   Pain Location Knee   Pain Orientation Left   Pain Type Chronic pain   Pain Onset More than a month ago   Aggravating Factors  Sit to stands and squats. Patient did not tolerate leg press well today, though he did not state this until after the exercise was completed.              [facility] PT Assessment - 03/31/15 0001    Ambulation/Gait   Gait velocity 1.09 m/s   Functional Gait  Assessment   Gait assessed  Yes   Gait Level Surface Walks 20 ft in less than 5.5 sec, no assistive devices, good speed, no evidence for imbalance, normal gait pattern, deviates no more than 6 in outside of the 12 in walkway width.   Change in Gait Speed Able to smoothly change walking speed without loss of balance or gait deviation. Deviate no more than 6 in outside of the 12 in walkway width.   Gait with Horizontal Head Turns Performs head turns smoothly with slight change in gait velocity (eg, minor disruption to smooth gait path), deviates 6-10 in outside 12 in walkway width, or uses an assistive device.   Gait with Vertical Head Turns Performs task with slight change in gait velocity (eg, minor disruption to smooth gait path), deviates 6 - 10 in outside 12 in walkway width or uses assistive device   Gait and Pivot Turn Pivot turns safely in greater than 3 sec and stops with no loss of balance, or pivot turns safely within 3 sec and stops with mild imbalance, requires small steps to catch balance.   Step Over Obstacle  Is able to step over one shoe box (4.5 in total height) without changing gait speed. No evidence of imbalance.   Gait with Narrow Base of Support Ambulates 4-7 steps.   Gait with Eyes Closed Walks 20 ft, slow speed, abnormal gait pattern, evidence for imbalance, deviates 10-15 in outside 12 in walkway width. Requires more than 9 sec to ambulate 20 ft.   Ambulating Backwards Walks 20 ft, uses assistive device, slower speed, mild gait deviations, deviates 6-10 in outside 12 in walkway width.   Steps Alternating feet, must use rail.   Total Score 20      There-Ex    NuStep Level 3, 3 minutes (not-billed)    Leg Press  105# x 18 repetitions (cuing for full repetitions) 120#, 12 repetitions. Patient reported this was uncomfortable, but did not state this until he  was finished.  Side stepping x 10' with green t-band bilaterally  Hip extensions x 12 bilaterally green t-band bilaterally  Hip abductions x 12 bilaterally green t-band bilaterally Single leg balance x 10 bilaterally, maximum times held per side ~5 seconds. Patient required cuing for true hip flexion to initiate and to watch his shoulders in the mirror. Patient displays Trendelenburg stance and attempts to compensate with trunk side flexion due to glute med weakness.    Gait Training  Tandem walking without HHA, cuing for narrow stance and no loss of balance.  Gait with eyes closed, initially went to his left. Cued to find target and take equal strides by feeling the floor 2 reps x 15'  Gait with cuing for "long fast strides" 2x 30'  High step marching in and out of // bars 2x10' in // bars and 1x30' out of parallel bars with cuing for full foot contact prior to taking his next steps.  Backwards ambulation x 20' x 2, with cuing for wider BOS and longer steps. No loss of balance   FGA- 20/30 51mgait speed 1.09 m/s                      PT Education - 03/31/15 1540    Education provided Yes   Education Details Re-instructed HEP. Educated patient and daughter on need to use a cane during ambulation. Patient educated on technique for erect posture going up and down stairs as well as a wider BOS during turns.    Person(s) Educated Patient;Child(ren)   Methods Explanation;Demonstration;Tactile cues   Comprehension Verbalized understanding;Returned demonstration;Verbal cues required;Tactile cues required          PT Short Term Goals - 03/31/15 1659    PT SHORT TERM GOAL #1   Title Patient and caregiver independent in initial HEP for balance and postural strength.  03/10/15   Status Partially Met   PT SHORT TERM GOAL #2   Title Patient to ambulate 500' with cane and distant supervision for wayfinding.  03/10/15   Status Partially Met   PT SHORT TERM GOAL #3   Title Patient  will demonstrate decreased fall risk with Berg score 47/56 or greater.  03/10/15   Status Achieved   PT SHORT TERM GOAL #4   Title Patient and caregiver to verablize understanding of fall prevention techniques for home.  03/10/15   Status Partially Met           PT Long Term Goals - 03/31/15 1700    PT LONG TERM GOAL #1   Title Patient and caregiver independent in HEP for balance, LE and postural strength.  04/07/15   Status Partially Met   PT LONG TERM GOAL #2   Title Patient to demonstrate decreased fall risk with TUG in 13 seconds or less.  04/07/15   Status Achieved   PT LONG TERM GOAL #3   Title Patient to demonstrate decreased fall risk with Berg score 49/56 or greater.  04/07/15   Status Not Met   PT LONG TERM GOAL #4   Title Daughter to report 25% decrease in falls at home.  04/07/15   Status Achieved   PT LONG TERM GOAL #5   Title Patient and caregiver to demonstrate fall recovery technique independently.  04/07/15   Status Not Met   Additional Long Term Goals   Additional Long Term Goals Yes   PT LONG TERM GOAL #6   Title Patient will demonstrate an FGA score of at least 23/30 to display improved safety with dynamic mobility by 04/07/15.    Status New               Plan - 03/31/15 1541    Clinical Impression Statement Patient demonstrates improving single leg balance, though he continues to show deficits with his cognition and memory making him a high falls risk. When cued to walk quickly, he is able to take long reciprocal strides and ambulate at good speeds (1.09 m/s) with no loss of balance. Patient demonstrates medium falls risk with FGA score of 20/30 today with his largest impairment being in ambulating with his eyes closed. Patient would continue to benefit from skilled PT services to address his balance and higher level gait tasks safely.    Pt will benefit from skilled therapeutic intervention in order to improve on the following deficits Abnormal gait;Decreased  balance;Decreased safety awareness;Postural dysfunction;Difficulty walking;Decreased mobility;Decreased strength   Rehab Potential Good   Clinical Impairments Affecting Rehab Potential Alzheimer's dementia and decreased ability to independently complete HEP.    PT Frequency 2x / week   PT Duration 8 weeks   PT Treatment/Interventions ADLs/Self Care Home Management;Therapeutic activities;Patient/family education;Therapeutic exercise;DME Instruction;Functional mobility training;Neuromuscular re-education;Stair training;Balance training;Gait training   PT Next Visit Plan Follow up on HEP adherence and safety with mobility, stress use of cane. Continue to progress with narrow BOS ambulation, eyes closed tasks, strengthening, wide BOS with turns.    PT Home Exercise Plan Continue with current HEP, instructed this to daughter who stated she still had old plan.    Consulted and Agree with Plan of Care Patient;Family member/caregiver   Family Member Consulted daughter        Problem List Patient Active Problem List   Diagnosis Date Noted  . Alzheimer's disease 02/09/2015  . Lewy body dementia 02/09/2015  . Apraxia 02/09/2015    Kerman Passey, PT, DPT    03/31/2015, 5:06 PM  [place] MAIN [facility] SERVICES [address] [place], [place], [address] Phone: [phone]   Fax:  [phone]

## 2015-04-03 ENCOUNTER — Ambulatory Visit: Payer: Medicare PPO | Admitting: Physical Therapy

## 2015-04-04 ENCOUNTER — Ambulatory Visit: Payer: Medicare PPO | Admitting: Physical Therapy

## 2015-04-04 ENCOUNTER — Encounter: Payer: Self-pay | Admitting: Physical Therapy

## 2015-04-04 DIAGNOSIS — R269 Unspecified abnormalities of gait and mobility: Secondary | ICD-10-CM

## 2015-04-04 DIAGNOSIS — R293 Abnormal posture: Secondary | ICD-10-CM

## 2015-04-04 DIAGNOSIS — R482 Apraxia: Secondary | ICD-10-CM

## 2015-04-04 NOTE — Therapy (Signed)
[place] MAIN [facility] SERVICES [address] [place], [place], [address] Phone: [phone]   Fax:  [phone]  Physical Therapy Treatment  Patient Details  Name: Jonathan Bean MRN: [id number] Date of Birth: Sep 20, 1926 Referring Provider:  Derinda Late, MD  Encounter Date: 04/04/2015      PT End of Session - 04/04/15 1515    Visit Number 10   Number of Visits 17   Date for PT Re-Evaluation 04/07/15   Authorization Type [employer] medicare   Authorization Time Period 3/34/16-04/07/15   PT Start Time 1415   PT Stop Time 1500   PT Time Calculation (min) 45 min   Equipment Utilized During Treatment Gait belt   Activity Tolerance Patient tolerated treatment well   Behavior During Therapy [facility] for tasks assessed/performed      Past Medical History  Diagnosis Date  . Alzheimer's dementia   . Apraxia   . Lewy body dementia     History reviewed. No pertinent past surgical history.  There were no vitals filed for this visit.  Visit Diagnosis:  Abnormality of gait  Apraxia  Posture abnormality      Subjective Assessment - 04/04/15 1428    Subjective Daughter reports that patient had a fall last week, which was documented at the last treatment. Therapy exercises at home have been off and on.   Patient is accompained by: Family member   Pertinent History Alzheimer's dementia    Limitations Walking   Patient Stated Goals To improve balance.    Currently in Pain? No/denies       Neuromusuclar re-education Single leg stance bilaterally for 3 seconds x 10 Marching in place with eyes closed x 20 total Eyes closed x 20 seconds  Tandem stance x 30 seconds, added head turns  Backwards walking with yellow band x 10' for 3 repetitions  Forward walking with yellow band x 10' for 2 repetitions  Standing hip abductions x 12 bilaterally with yellow band   There-ex  Seated rows with cuing for scapular adduction and retraction 30# x 15  repetitions  Seated lat pulldowns 30# x 15 repetitions with cuing to bring arms down to nose level to avoid scapular anterior tipping.  Leg Curls 30# on machine x 20. Patient required min-mod physical assistance and directions for sitting in and placing LEs to complete in machine.   Patient required mod-max verbal cuing for exercise performance and directions in hallways as he has short term memory deficits and navigational deficits secondary to Alzheimer's. With cuing patient was able to perform all exercises appropriately, with appropriate muscular firing.                         PT Education - 04/04/15 1516    Education provided Yes   Education Details Patient and daughter re-instructed in [place]. Patient and daughter educated on necessity for HEP compliance and to perform balance exercises daily.    Person(s) Educated Patient   Methods Explanation;Demonstration;Tactile cues;Verbal cues   Comprehension Verbalized understanding;Returned demonstration;Verbal cues required;Tactile cues required          PT Short Term Goals - 04/04/15 1529    PT SHORT TERM GOAL #1   Title Patient and caregiver independent in initial HEP for balance and postural strength.  03/10/15   Status Partially Met   PT SHORT TERM GOAL #2   Title Patient to ambulate 500' with cane and distant supervision for wayfinding.  03/10/15  Status Partially Met   PT SHORT TERM GOAL #3   Title Patient will demonstrate decreased fall risk with Berg score 47/56 or greater.  03/10/15   Status Achieved   PT SHORT TERM GOAL #4   Title Patient and caregiver to verablize understanding of fall prevention techniques for home.  03/10/15   Status Partially Met           PT Long Term Goals - 04/04/15 1529    PT LONG TERM GOAL #1   Title Patient and caregiver independent in HEP for balance, LE and postural strength.  04/07/15   Status Partially Met   PT LONG TERM GOAL #2   Title Patient to demonstrate decreased fall  risk with TUG in 13 seconds or less.  04/07/15   Status Achieved   PT LONG TERM GOAL #3   Title Patient to demonstrate decreased fall risk with Berg score 49/56 or greater.  04/07/15   Status Not Met   PT LONG TERM GOAL #4   Title Daughter to report 25% decrease in falls at home.  04/07/15   Status Achieved   PT LONG TERM GOAL #5   Title Patient and caregiver to demonstrate fall recovery technique independently.  04/07/15   Status Not Met   PT LONG TERM GOAL #6   Title Patient will demonstrate an FGA score of at least 23/30 to display improved safety with dynamic mobility by 04/07/15.    Status New               Plan - 04/04/15 1523    Clinical Impression Statement Patient does not describe any new falls or loss of balance. Patient continues to demonstrate spatial and orientation/directional deficits but is able to ambulate safely with cuing for longer strides. Patient would benefit from additional therapy to address his balance deficits in single leg stance, with his eyes closed, and with a narrow BOS. Patient provided with HEP for balance and postural strengthening. Patient would continue to benefit from skilled PT services to address his dynamic balance deficits.    Pt will benefit from skilled therapeutic intervention in order to improve on the following deficits Abnormal gait;Decreased balance;Decreased safety awareness;Postural dysfunction;Difficulty walking;Decreased mobility;Decreased strength   Rehab Potential Good   Clinical Impairments Affecting Rehab Potential Alzheimer's dementia and decreased ability to independently complete HEP.    PT Frequency 2x / week   PT Duration 8 weeks   PT Treatment/Interventions ADLs/Self Care Home Management;Therapeutic activities;Patient/family education;Therapeutic exercise;DME Instruction;Functional mobility training;Neuromuscular re-education;Stair training;Balance training;Gait training   PT Next Visit Plan Follow up on HEP and use of cane in  public. Progress towards discharge if FGA appears to be safe and or plateaud.    PT Home Exercise Plan Pt provided HEP, see patient instructions.    Consulted and Agree with Plan of Care Patient;Family member/caregiver   Family Member Consulted daughter        Problem List Patient Active Problem List   Diagnosis Date Noted  . Alzheimer's disease 02/09/2015  . Lewy body dementia 02/09/2015  . Apraxia 02/09/2015   Kerman Passey, PT, DPT    04/05/2015, 7:56 AM  [place] MAIN [facility] SERVICES [address] [place], [place], [address] Phone: [phone]   Fax:  [phone]

## 2015-04-04 NOTE — Patient Instructions (Addendum)
All exercises provided were adapted from hep2go.com. Patient was provided a written handout with pictures as described. Any additional cues were manually entered in to handout and copied in to this document.   Daily Balance Home Exercises  SINGLE LEG STANCE - SLS (6 repetitions for 1 set 1x per day)   Stand on one leg and maintain your balance.    TANDEM STANCE WITH SUPPORT (6 repetitions for 1 set 1x per day)   Stand in front of a chair, table or counter top for support. Then place the heel of one foot so that it is touching the toes of the other foot. Maintain your balance in this position.     Static Balance: Rhomberg position on floor (6 repetitions for 1 set 1x per day)   Standing with your feet together and arms across your chest, practice keeping your balance with your eyes open. Stand next to a wall or stable surface for balance support if needed.  Do the same exercise with your eyes closed to make it harder.      [employer] and Backward  (10 times for 1 set 1x per day)   With a band around your ankles in a wide stance, step forward with one foot while keeping tension on the band, then bring the other foot forward. Take a couple of steps forward and then repeat backwards     Exercises for the gym  [employer] (10 times, 2 seconds holding, 2 sets, 3x weeks)  Set machine to appropriate weight.   Grabbing handles, pull back squeezing your shoulder blades together at end range.  Return to start position and repeat.  Machine style may vary depending on gym.     Lat Pulldown (12 times, 2 seconds holding, 2 sets, 3x weeks)  Start by sitting in the seat underneath the cable with your arms reaching up and grasping the bar. Lean slightly back and pull down so that the weight touches right beneath your chin. Control as you allow the weight to return to the "up" position. Do not allow your body to shift back as you pull down. Keep your core stable. Repeat.     Seated  Hamstring Curl (15 times, 2 seconds holding, 2 sets, 3x weeks)  Make all necessary adjustments to the machine before starting exercise. Start with your legs straight out in front of you, the backs of your ankles on the padding. Using your hamstrings, pull the pad down towards the floor and return to start in one fluid motion.

## 2015-04-05 ENCOUNTER — Ambulatory Visit: Payer: Medicare PPO | Admitting: Physical Therapy

## 2015-04-06 ENCOUNTER — Ambulatory Visit: Payer: Medicare PPO | Admitting: Physical Therapy

## 2015-04-06 DIAGNOSIS — R269 Unspecified abnormalities of gait and mobility: Secondary | ICD-10-CM

## 2015-04-06 DIAGNOSIS — R293 Abnormal posture: Secondary | ICD-10-CM

## 2015-04-06 DIAGNOSIS — R482 Apraxia: Secondary | ICD-10-CM

## 2015-04-06 NOTE — Patient Instructions (Signed)
SHOULDER: Diagonal - Up and Away (Band)   Start with arm down and across body. Holding band, pull up and out. End with elbow straight and palm forward. Hold ___ seconds. Use ________ band. ___ reps per set, ___ sets per day, ___ days per week  Copyright  VHI. All rights reserved.  Shoulder Retraction   Facing chest height anchor, grasp ends of band and pull hands to chest, squeezing shoulder blades together. Hold ___ seconds. Repeat ___ times. Do ___ sessions per day. Safety Note: Be sure anchor is secure.  Copyright  VHI. All rights reserved.  Shoulder Extension (Sit Box)   Use ___ springs. Pull arms backward. Keep spine neutral. Do ___ times, ___ times per day.  Copyright  VHI. All rights reserved.  Copyright  VHI. All rights reserved.  Reverse Fly / Shoulder Retraction   Extend both arms in front of body at shoulder height, palms down, holding band. Move arms out to sides, squeeze shoulder blades together. Repeat ___ times. Do ___ sessions per day.   Copyright  VHI. All rights reserved.   Roll   Inhale and bring shoulders up, back, then exhale and relax shoulders down. Repeat ___ times. Do ___ times per day.  Copyright  VHI. All rights reserved.

## 2015-04-07 ENCOUNTER — Encounter: Payer: Self-pay | Admitting: Physical Therapy

## 2015-04-07 DIAGNOSIS — R269 Unspecified abnormalities of gait and mobility: Secondary | ICD-10-CM | POA: Diagnosis not present

## 2015-04-07 NOTE — Therapy (Signed)
[place] MAIN [facility] SERVICES [address] [place], [place], [address] Phone: [phone]   Fax:  [phone]  Physical Therapy Treatment/Discharge Summary  Patient Details  Name: Jonathan Bean MRN: [id number] Date of Birth: [birth year] Referring Provider:  Derinda Late, MD  Encounter Date: 04/06/2015      PT End of Session - 04/07/15 0930    Visit Number 11   Number of Visits 17   Date for PT Re-Evaluation 04/07/15   Authorization Type [employer] medicare   Authorization Time Period 3/34/16-04/07/15   PT Start Time 1530   PT Stop Time 1635   PT Time Calculation (min) 65 min   Equipment Utilized During Treatment Gait belt   Activity Tolerance Patient tolerated treatment well   Behavior During Therapy [facility] for tasks assessed/performed      Past Medical History  Diagnosis Date  . Alzheimer's dementia   . Apraxia   . Lewy body dementia     History reviewed. No pertinent past surgical history.  There were no vitals filed for this visit.  Visit Diagnosis:  Apraxia  Posture abnormality  Abnormality of gait      Subjective Assessment - 04/07/15 0929    Subjective Patient reports doing well. He presents to therapy without AD and he appears alert and talkative. Patient's daughter reports that he hasn't been doing as much exercise but that they have been walking more. He reports increased knee/back pain over last few days but denies any pain currently.   Patient is accompained by: Family member   Patient Stated Goals To improve balance.    Currently in Pain? No/denies            [facility] PT Assessment - 04/06/15 1608    Berg Balance Test   Sit to Stand Able to stand without using hands and stabilize independently   Standing Unsupported Able to stand safely 2 minutes   Sitting with Back Unsupported but Feet Supported on Floor or Stool Able to sit safely and securely 2 minutes   Stand to Sit Sits safely with minimal use of  hands   Transfers Able to transfer safely, minor use of hands   Standing Unsupported with Eyes Closed Able to stand 10 seconds safely   Standing Ubsupported with Feet Together Able to place feet together independently and stand 1 minute safely   From Standing, Reach Forward with Outstretched Arm Can reach confidently >25 cm (10")   From Standing Position, Pick up Object from Floor Able to pick up shoe safely and easily   From Standing Position, Turn to Look Behind Over each Shoulder Looks behind from both sides and weight shifts well   Turn 360 Degrees Able to turn 360 degrees safely but slowly   Standing Unsupported, Alternately Place Feet on Step/Stool Able to stand independently and safely and complete 8 steps in 20 seconds   Standing Unsupported, One Foot in Front Able to take small step independently and hold 30 seconds   Standing on One Leg Able to lift leg independently and hold equal to or more than 3 seconds   Total Score 50      TREATMENT: Warm up on treadmill 1.5 mph x4 min (unbilled);  Advanced HEP with instruction in postural strengthening exercises: Red tband: -UE diagonal x10 bilaterally; -tband rows x10 with BUE; -tband shoulder extension x10 with BUE; -tband BUE shoulder horizontal abduction x10;  Posterior shoulder rolls x10 Patient required min-moderate verbal/tactile cues for correct exercise technique.  Also  educated patient in walking program on treadmill which was written on handout.  Patient was also educated about silver sneakers program and plans to follow up with meeting with wellness coordinator about joining fitness center/[employer].   Following therapeutic exericse, educated patient/caregiver in floor transfers with instruction to roll to side and then get in tall kneeling, progressing to 1/2 kneeling to standing. Educated patient/caregiver in how to push up from level surface and to use a pillow as needed to reduce discomfort in knees with  kneeling. Patient required min Vcs for rolling to initiate transfer.   Instructed patient in Freeman Balance assessment to assess improvement in balance ability. Patient scored 50/56 which puts him at a moderate risk for falls; this is a significant improvement from initial eval which was a 42/56;  Patient has been given balance and strengthening exercise for HEP and reports independence in exercise.      PHYSICAL THERAPY DISCHARGE SUMMARY  Visits from Start of Care: 11  Current functional level related to goals / functional outcomes: See above in objective measures   Remaining deficits: Impaired balance/posture, cognitive deficits related to alzheimer's disease   Education / Equipment: See above  Plan: Patient agrees to discharge.  Patient goals were met. Patient is being discharged due to meeting the stated rehab goals.  ?????                          PT Education - 04/07/15 0930    Education provided Yes   Education Details HEP, see patient instructions; also provided education in floor transfers and how to progress HEP at home.   Person(s) Educated Patient;Child(ren)   Methods Explanation;Demonstration;Tactile cues;Verbal cues   Comprehension Verbalized understanding;Returned demonstration;Verbal cues required;Tactile cues required          PT Short Term Goals - 04/07/15 0933    PT SHORT TERM GOAL #1   Title Patient and caregiver independent in initial HEP for balance and postural strength.  03/10/15   Status Achieved   PT SHORT TERM GOAL #2   Title Patient to ambulate 500' with cane and distant supervision for wayfinding.  03/10/15   Status Partially Met   PT SHORT TERM GOAL #3   Title Patient will demonstrate decreased fall risk with Berg score 47/56 or greater.  03/10/15   Status Achieved   PT SHORT TERM GOAL #4   Title Patient and caregiver to verablize understanding of fall prevention techniques for home.  03/10/15   Status Achieved            PT Long Term Goals - 04/06/15 1615    PT LONG TERM GOAL #1   Title Patient and caregiver independent in HEP for balance, LE and postural strength.  04/07/15   Status Achieved   PT LONG TERM GOAL #2   Title Patient to demonstrate decreased fall risk with TUG in 13 seconds or less.  04/07/15   Status Achieved   PT LONG TERM GOAL #3   Title Patient to demonstrate decreased fall risk with Berg score 49/56 or greater.  04/07/15   Status Achieved   PT LONG TERM GOAL #4   Title Daughter to report 25% decrease in falls at home.  04/07/15   Status Achieved   PT LONG TERM GOAL #5   Title Patient and caregiver to demonstrate fall recovery technique independently.  04/07/15   Status Achieved   PT LONG TERM GOAL #6   Title Patient will demonstrate  an FGA score of at least 23/30 to display improved safety with dynamic mobility by 05/07/15.    Status On-going               Plan - 07-May-2015 0931    Clinical Impression Statement Patient was instructed in postural strengthening exercise. He required increased cues for correct technique. He was also instructed in how to transfer from floor to standing x2-3 reps with instruction to family as well on how to assist with floor transfers for better ease. patient demonstrates improvement in balance and LE strength. He expressed interest in continuing with exercise joining a silver sneakers program. PT recommends discharge from PT at this time and will refer patient to hospital gym for continued fitness goals.   Pt will benefit from skilled therapeutic intervention in order to improve on the following deficits Abnormal gait;Decreased balance;Decreased safety awareness;Postural dysfunction;Difficulty walking;Decreased mobility;Decreased strength   Rehab Potential Good   Clinical Impairments Affecting Rehab Potential Alzheimer's dementia and decreased ability to independently complete HEP.    PT Frequency 2x / week   PT Duration 8 weeks   PT  Treatment/Interventions ADLs/Self Care Home Management;Therapeutic activities;Patient/family education;Therapeutic exercise;DME Instruction;Functional mobility training;Neuromuscular re-education;Stair training;Balance training;Gait training   PT Home Exercise Plan Pt provided HEP, see patient instructions.    Consulted and Agree with Plan of Care Patient;Family member/caregiver   Family Member Consulted daughter          G-Codes - 05/07/15 9924    Functional Assessment Tool Used Merrilee Jansky balance assessment, clinical judgement   Functional Limitation Mobility: Walking and moving around   Mobility: Walking and Moving Around Goal Status [phone]) At least 20 percent but less than 40 percent impaired, limited or restricted   Mobility: Walking and Moving Around Discharge Status [phone]) At least 20 percent but less than 40 percent impaired, limited or restricted      Problem List Patient Active Problem List   Diagnosis Date Noted  . Alzheimer's disease 02/09/2015  . Lewy body dementia 02/09/2015  . Apraxia 02/09/2015    Alessandra Grout, PT, DPT, [phone] 05-07-2015 9:41 AM   [place] MAIN [facility] SERVICES [address] [place], [place], [address] Phone: [phone]   Fax:  [phone]

## 2015-04-10 ENCOUNTER — Ambulatory Visit: Payer: Medicare PPO | Admitting: Physical Therapy

## 2015-04-11 ENCOUNTER — Encounter: Payer: Medicare PPO | Admitting: Physical Therapy

## 2015-04-12 ENCOUNTER — Ambulatory Visit: Payer: Medicare PPO | Admitting: Physical Therapy

## 2015-04-13 ENCOUNTER — Encounter: Payer: Medicare PPO | Admitting: Physical Therapy

## 2015-04-18 ENCOUNTER — Encounter: Payer: Medicare PPO | Admitting: Physical Therapy

## 2015-04-19 ENCOUNTER — Ambulatory Visit: Payer: Medicare PPO | Admitting: Physical Therapy

## 2015-04-20 ENCOUNTER — Encounter: Payer: Medicare PPO | Admitting: Physical Therapy

## 2015-04-21 ENCOUNTER — Ambulatory Visit: Payer: Medicare PPO | Admitting: Physical Therapy

## 2015-07-31 DIAGNOSIS — M170 Bilateral primary osteoarthritis of knee: Secondary | ICD-10-CM | POA: Insufficient documentation

## 2015-08-16 DIAGNOSIS — G20 Parkinson's disease: Secondary | ICD-10-CM | POA: Insufficient documentation

## 2015-08-21 DIAGNOSIS — G301 Alzheimer's disease with late onset: Secondary | ICD-10-CM

## 2015-08-21 DIAGNOSIS — F0280 Dementia in other diseases classified elsewhere without behavioral disturbance: Secondary | ICD-10-CM | POA: Insufficient documentation

## 2015-08-21 DIAGNOSIS — G250 Essential tremor: Secondary | ICD-10-CM | POA: Insufficient documentation

## 2016-02-01 ENCOUNTER — Emergency Department: Payer: Medicare PPO

## 2016-02-01 ENCOUNTER — Emergency Department
Admission: EM | Admit: 2016-02-01 | Discharge: 2016-02-02 | Disposition: A | Discharge status: Home / Self Care | Payer: Medicare PPO | Attending: Emergency Medicine | Admitting: Emergency Medicine

## 2016-02-01 ENCOUNTER — Encounter: Payer: Self-pay | Admitting: Emergency Medicine

## 2016-02-01 DIAGNOSIS — Z043 Encounter for examination and observation following other accident: Secondary | ICD-10-CM | POA: Insufficient documentation

## 2016-02-01 DIAGNOSIS — Z87891 Personal history of nicotine dependence: Secondary | ICD-10-CM | POA: Diagnosis not present

## 2016-02-01 DIAGNOSIS — Z79899 Other long term (current) drug therapy: Secondary | ICD-10-CM | POA: Diagnosis not present

## 2016-02-01 DIAGNOSIS — W1839XA Other fall on same level, initial encounter: Secondary | ICD-10-CM | POA: Diagnosis not present

## 2016-02-01 DIAGNOSIS — Z880 Allergy status to penicillin: Secondary | ICD-10-CM | POA: Insufficient documentation

## 2016-02-01 DIAGNOSIS — R609 Edema, unspecified: Secondary | ICD-10-CM

## 2016-02-01 DIAGNOSIS — G3183 Dementia with Lewy bodies: Secondary | ICD-10-CM | POA: Diagnosis not present

## 2016-02-01 DIAGNOSIS — Y9389 Activity, other specified: Secondary | ICD-10-CM | POA: Insufficient documentation

## 2016-02-01 DIAGNOSIS — R600 Localized edema: Secondary | ICD-10-CM | POA: Insufficient documentation

## 2016-02-01 DIAGNOSIS — E860 Dehydration: Secondary | ICD-10-CM | POA: Diagnosis not present

## 2016-02-01 DIAGNOSIS — R4182 Altered mental status, unspecified: Secondary | ICD-10-CM | POA: Diagnosis present

## 2016-02-01 DIAGNOSIS — Z7951 Long term (current) use of inhaled steroids: Secondary | ICD-10-CM | POA: Diagnosis not present

## 2016-02-01 DIAGNOSIS — Z791 Long term (current) use of non-steroidal anti-inflammatories (NSAID): Secondary | ICD-10-CM | POA: Diagnosis not present

## 2016-02-01 DIAGNOSIS — G309 Alzheimer's disease, unspecified: Secondary | ICD-10-CM | POA: Diagnosis not present

## 2016-02-01 DIAGNOSIS — R531 Weakness: Secondary | ICD-10-CM

## 2016-02-01 DIAGNOSIS — Y9289 Other specified places as the place of occurrence of the external cause: Secondary | ICD-10-CM | POA: Diagnosis not present

## 2016-02-01 DIAGNOSIS — Z7982 Long term (current) use of aspirin: Secondary | ICD-10-CM | POA: Insufficient documentation

## 2016-02-01 DIAGNOSIS — Y998 Other external cause status: Secondary | ICD-10-CM | POA: Diagnosis not present

## 2016-02-01 LAB — CBC
HCT: 43 % (ref 40.0–52.0)
Hemoglobin: 14.9 g/dL (ref 13.0–18.0)
MCH: 34.9 pg — AB (ref 26.0–34.0)
MCHC: 34.6 g/dL (ref 32.0–36.0)
MCV: 101.1 fL — ABNORMAL HIGH (ref 80.0–100.0)
PLATELETS: 162 10*3/uL (ref 150–440)
RBC: 4.26 MIL/uL — AB (ref 4.40–5.90)
RDW: 12.2 % (ref 11.5–14.5)
WBC: 7.3 10*3/uL (ref 3.8–10.6)

## 2016-02-01 LAB — COMPREHENSIVE METABOLIC PANEL
ALK PHOS: 75 U/L (ref 38–126)
ALT: 21 U/L (ref 17–63)
AST: 21 U/L (ref 15–41)
Albumin: 4.5 g/dL (ref 3.5–5.0)
Anion gap: 7 (ref 5–15)
BUN: 34 mg/dL — AB (ref 6–20)
CALCIUM: 9.5 mg/dL (ref 8.9–10.3)
CO2: 29 mmol/L (ref 22–32)
CREATININE: 0.71 mg/dL (ref 0.61–1.24)
Chloride: 103 mmol/L (ref 101–111)
GFR calc non Af Amer: >60 mL/min (ref >60)
Glucose, Bld: 154 mg/dL — ABNORMAL HIGH (ref 65–99)
Potassium: 3.5 mmol/L (ref 3.5–5.1)
SODIUM: 139 mmol/L (ref 135–145)
Total Bilirubin: 0.9 mg/dL (ref 0.3–1.2)
Total Protein: 7.6 g/dL (ref 6.5–8.1)

## 2016-02-01 LAB — URINALYSIS COMPLETE WITH MICROSCOPIC (ARMC ONLY)
Bilirubin Urine: NEGATIVE
GLUCOSE, UA: NEGATIVE mg/dL
Hgb urine dipstick: NEGATIVE
Leukocytes, UA: NEGATIVE
Nitrite: NEGATIVE
PROTEIN: 30 mg/dL — AB
RBC / HPF: NONE SEEN RBC/hpf (ref 0–5)
SQUAMOUS EPITHELIAL / LPF: NONE SEEN
Specific Gravity, Urine: 1.029 (ref 1.005–1.030)
pH: 5 (ref 5.0–8.0)

## 2016-02-01 LAB — GLUCOSE, CAPILLARY: GLUCOSE-CAPILLARY: 125 mg/dL — AB (ref 65–99)

## 2016-02-01 LAB — TSH: TSH: 0.912 u[IU]/mL (ref 0.350–4.500)

## 2016-02-01 LAB — TROPONIN I

## 2016-02-01 MED ORDER — SODIUM CHLORIDE 0.9 % IV BOLUS (SEPSIS): 500.0000 mL | Freq: Once | INTRAVENOUS | Status: DC

## 2016-02-01 NOTE — ED Notes (Signed)
No change in conditon

## 2016-02-01 NOTE — ED Notes (Signed)
Pt in via triage, pt with advanced dementia.  Pt daughter reports fall on 3/8 at home and another fall on 3/13, both of which he lost his balance.  Daughter reports pt recovered back to baseline w/in 2 days of first fall but has had altered mental status since second fall and has not recovered to his baseline ambulatory status.  Pt A/Ox3, hypertensive with other vitals WDL.  No immediate distress at this time.

## 2016-02-01 NOTE — ED Notes (Signed)
No change in condition.

## 2016-02-01 NOTE — ED Notes (Signed)
Patient transported to CT 

## 2016-02-01 NOTE — ED Notes (Signed)
Daughter states patient has had two falls in the past 10 days.  Daughter states patient seems to be disoriented since yesterday, mouth noticed to be crooked this morning, and poor balance since March 8th, and left foot and leg are slightly swollen.

## 2016-02-01 NOTE — ED Provider Notes (Addendum)
[facility] Emergency Department Provider Note  ____________________________________________  Time seen: Approximately 8 PM  I have reviewed the triage vital signs and the nursing notes.   HISTORY  Chief Complaint Fall and Altered Mental Status    HPI Jonathan Bean is a 82 y.o. male with a history of Alzheimer's as well as Louie body dementia and an essential tremor who is presenting today with altered mental status since March 8. His daughter, who is also his power of attorney, is at the bedside and says that he had difficulty walking and then fell on March 8. She says that he was confused for several days afterwards and then regained his normal baseline mental status. She says that he fell again this past Monday, March 13, after hallucinating that something was on the floor where it wasn't. The daughter says that since this fall, the patient has been confused and not acting his normal cognitive baseline. She says that this morning she also noticed left and right facial drooping as well as bilateral lower extremity swelling with the left being greater than the right. She says that the patient has had swelling of his bilateral lower extremities as well as discoloration the mornings in the past. At this point, the swelling has improved.The daughter says that the patient also has a history of a TIA and takes a low-dose aspirin daily.   Past Medical History  Diagnosis Date  . Alzheimer's dementia   . Apraxia   . Lewy body dementia     Patient Active Problem List   Diagnosis Date Noted  . Alzheimer's disease 02/09/2015  . Lewy body dementia 02/09/2015  . Apraxia 02/09/2015    History reviewed. No pertinent past surgical history.  Current Outpatient Rx  Name  Route  Sig  Dispense  Refill  . acyclovir (ZOVIRAX) 200 MG capsule   Oral   Take 200 mg by mouth 5 (five) times daily.         Marland Kitchen aspirin EC 81 MG tablet   Oral   Take 81 mg by mouth daily.          . cholecalciferol (VITAMIN D) 1000 UNITS tablet   Oral   Take 1,000 Units by mouth daily.         Marland Kitchen dextromethorphan-guaiFENesin (MUCINEX DM) 30-600 MG per 12 hr tablet   Oral   Take 1 tablet by mouth 2 (two) times daily.         . diphenhydrAMINE (BENADRYL) 25 mg capsule   Oral   Take 25 mg by mouth every 6 (six) hours as needed.         . donepezil (ARICEPT) 10 MG tablet   Oral   Take 10 mg by mouth at bedtime.         . fexofenadine (ALLEGRA) 180 MG tablet   Oral   Take 180 mg by mouth daily.         . fluticasone (FLONASE) 50 MCG/ACT nasal spray   Each Nare   Place 1 spray into both nostrils daily.         . meloxicam (MOBIC) 7.5 MG tablet   Oral   Take 7.5 mg by mouth daily.         . primidone (MYSOLINE) 50 MG tablet   Oral   Take by mouth 2 (two) times daily with a meal.         . propranolol (INDERAL) 20 MG tablet   Oral   Take 20  mg by mouth 2 (two) times daily.         . vitamin B-12 (CYANOCOBALAMIN) 1000 MCG tablet   Oral   Take 1,000 mcg by mouth daily.           Allergies Penicillins  No family history on file.  Social History Social History  Substance Use Topics  . Smoking status: Former Smoker    Types: Pipe    Quit date: 11/19/1989  . Smokeless tobacco: None  . Alcohol Use: None    Review of Systems Constitutional: No fever/chills Eyes: No visual changes. ENT: No sore throat. Cardiovascular: Denies chest pain. Respiratory: Denies shortness of breath. Gastrointestinal: No abdominal pain.  No nausea, no vomiting.  No diarrhea.  No constipation. Genitourinary: Negative for dysuria. Musculoskeletal: Negative for back pain. Skin: Negative for rash. Neurological: Negative for headaches, focal weakness or numbness.  10-point ROS otherwise negative.  ____________________________________________   PHYSICAL EXAM:  VITAL SIGNS: ED Triage Vitals  Enc Vitals Group     BP 02/01/16 1625 124/84 mmHg     Pulse  Rate 02/01/16 1625 54     Resp 02/01/16 1625 16     Temp 02/01/16 1625 97.8 F (36.6 C)     Temp Source 02/01/16 1625 Oral     SpO2 02/01/16 1625 98 %     Weight 02/01/16 1625 155 lb (70.308 kg)     Height 02/01/16 1625 5\' 6"  (1.676 m)     Head Cir --      Peak Flow --      Pain Score 02/01/16 1626 0     Pain Loc --      Pain Edu? --      Excl. in [place]? --     Constitutional: Alert and oriented. Well appearing and in no acute distress. Eyes: Conjunctivae are normal. PERRL. EOMI. Head: Atraumatic. Nose: No congestion/rhinnorhea. Mouth/Throat: Mucous membranes are moist.   Neck: No stridor.   Cardiovascular: Normal rate, regular rhythm. Grossly normal heart sounds.  Good peripheral circulation. Respiratory: Normal respiratory effort.  No retractions. Lungs CTAB. Gastrointestinal: Soft and nontender. No distention.  No CVA tenderness. Musculoskeletal: Mild bilateral lower extremity edema just to the feet and ankles with the left being greater than the right. Bilateral dorsalis pedis pulses but with the left being weaker. Neurologic:  Normal speech and language. Alert and oriented to himself as well as his birthdate. Does not know the year, however the daughter says that is normal for him. No gross focal neurologic deficits are appreciated. No ataxia on finger to nose testing. The patient does have a tremor to the bilateral upper extremities. Skin:  Skin is warm, dry and intact. No rash noted. Psychiatric: Mood and affect are normal. Speech and behavior are normal.  ____________________________________________   LABS (all labs ordered are listed, but only abnormal results are displayed)  Labs Reviewed  COMPREHENSIVE METABOLIC PANEL - Abnormal; Notable for the following:    Glucose, Bld 154 (*)    BUN 34 (*)    All other components within normal limits  CBC - Abnormal; Notable for the following:    RBC 4.26 (*)    MCV 101.1 (*)    MCH 34.9 (*)    All other components within  normal limits  URINALYSIS COMPLETEWITH MICROSCOPIC ([facility] ONLY) - Abnormal; Notable for the following:    Color, Urine YELLOW (*)    APPearance CLEAR (*)    Ketones, ur 1+ (*)    Protein, ur 30 (*)  Bacteria, UA RARE (*)    All other components within normal limits  GLUCOSE, CAPILLARY - Abnormal; Notable for the following:    Glucose-Capillary 125 (*)    All other components within normal limits  TROPONIN I  TSH  CBG MONITORING, ED   ____________________________________________  EKG  ED ECG REPORT I, Dona Klemann,  Youlanda Roys, the attending physician, personally viewed and interpreted this ECG.   Date: 02/01/2016  EKG Time: 1952  Rate: 69  Rhythm: Wandering atrial pacemaker  Axis: Normal axis  Intervals:none  ST&T Change: No ST segment elevation or depression. No abnormal T-wave inversion.  ____________________________________________  RADIOLOGY   IMPRESSION: No acute cardiopulmonary process seen. No displaced rib fractures identified.   Electronically Signed By: Garald Balding M.D. On: 02/01/2016 19:48   IMPRESSION: No focal acute intracranial abnormality identified.  Stable chronic diffuse atrophy.   Electronically Signed By: Abelardo Diesel M.D. On: 02/01/2016 19:35 ____________________________________________   PROCEDURES    ____________________________________________   INITIAL IMPRESSION / ASSESSMENT AND PLAN / ED COURSE  Pertinent labs & imaging results that were available during my care of the patient were reviewed by me and considered in my medical decision making (see chart for details).  ----------------------------------------- 11:47 PM on 02/01/2016 -----------------------------------------  Patient status has improved this point. Per his daughter was at the bedside he is acting "more himself." He has not had a facial droop throughout the stay in the emergency department. We discussed pursuing IV fluids here in the ER but the  patient has tolerated and ensure and we did discuss possible dehydration and make sure that he is putting fluids at home. Family understands plan and is willing to comply. We did discuss admission but agreed that at this point with the patient status improved and a very reassuring workup at this point that it would likely be better for the patient, superficial because the sundowning, that he not be admitted to the hospital. He will follow-up with his primary care doctor, Dr.Baboff.  Family understands the plan and is willing to comply. ____________________________________________   FINAL CLINICAL IMPRESSION(S) / ED DIAGNOSES  Weakness. Dehydration. Peripheral edema.    Orbie Pyo, MD 02/01/16 2348  Heart rate of 76 upon reassessment. Unclear why pulses picking up a 38. Possibly secondary to somewhat irregular rhythm.  Orbie Pyo, MD 02/01/16 [id number]

## 2016-02-01 NOTE — Discharge Instructions (Signed)
Dehydration Dehydration means your body does not have as much fluid as it needs. Your kidneys, brain, and heart will not work properly without the right amount of fluids and salt. Older adults are more likely to become dehydrated than younger adults. This is because:   Their bodies do not hold water as well.  Their bodies do not respond to temperature changes as well.  They do not get thirsty as easily or as quickly. HOME CARE  Ask your doctor how to replace body fluid losses (rehydrate).  Drink enough fluids to keep your pee (urine) clear or pale yellow.  Drink small amounts of fluids often if you feel sick to your stomach (nauseous) or throw up (vomit).  Eat like you normally do.  Avoid:  Foods or drinks high in sugar.  Bubbly (carbonated) drinks.  Juice.  Very hot or cold fluids.  Drinks with caffeine.  Fatty, greasy foods.  Alcohol.  Tobacco.  Eating too much.  Gelatin desserts.  Wash your hands to avoid spreading germs (bacteria, viruses).  Only take medicine as told by your doctor.  Keep all doctor visits as told. GET HELP IF:  You have belly (abdominal) pain that gets worse or stays in one spot (localizes).  You have a rash, stiff neck, or bad headache.  You get easily annoyed, sleepy, or are hard to wake up.  You feel weak, dizzy, or very thirsty.  You have a fever. GET HELP RIGHT AWAY IF:   You cannot drink fluid without throwing up.  You get worse even with treatment.  You throw up often.  You have watery poop (diarrhea) often.  Your vomit has blood in it or looks greenish.  Your poop (stool) has blood in it or looks black and tarry.  You have not peed in 6 to 8 hours or have only peed a small amount of very dark pee.  You pass out (faint). MAKE SURE YOU:   Understand these instructions.  Will watch your condition.  Will get help right away if you are not doing well or get worse.   This information is not intended to replace  advice given to you by your health care provider. Make sure you discuss any questions you have with your health care provider.   Document Released: 10/24/2011 Document Revised: 11/09/2013 Document Reviewed: 07/12/2013 Elsevier Interactive Patient Education 2016 Elsevier Inc.  Peripheral Edema You have swelling in your legs (peripheral edema). This swelling is due to excess accumulation of salt and water in your body. Edema may be a sign of heart, kidney or liver disease, or a side effect of a medication. It may also be due to problems in the leg veins. Elevating your legs and using special support stockings may be very helpful, if the cause of the swelling is due to poor venous circulation. Avoid long periods of standing, whatever the cause. Treatment of edema depends on identifying the cause. Chips, pretzels, pickles and other salty foods should be avoided. Restricting salt in your diet is almost always needed. Water pills (diuretics) are often used to remove the excess salt and water from your body via urine. These medicines prevent the kidney from reabsorbing sodium. This increases urine flow. Diuretic treatment may also result in lowering of potassium levels in your body. Potassium supplements may be needed if you have to use diuretics daily. Daily weights can help you keep track of your progress in clearing your edema. You should call your caregiver for follow up care as recommended.  SEEK IMMEDIATE MEDICAL CARE IF:   You have increased swelling, pain, redness, or heat in your legs.  You develop shortness of breath, especially when lying down.  You develop chest or abdominal pain, weakness, or fainting.  You have a fever.   This information is not intended to replace advice given to you by your health care provider. Make sure you discuss any questions you have with your health care provider.   Document Released: 12/12/2004 Document Revised: 01/27/2012 Document Reviewed: 05/17/2015 Elsevier  Interactive Patient Education 2016 Elsevier Inc.  Weakness Weakness is a lack of strength. You may feel weak all over your body or just in one part of your body. Weakness can be serious. In some cases, you may need more medical tests. HOME [place] a well-balanced diet.  Try to exercise every day.  Only take medicines as told by your doctor. GET HELP RIGHT AWAY IF:   You cannot do your normal daily activities.  You cannot walk up and down stairs, or you feel very tired when you do so.  You have shortness of breath or chest pain.  You have trouble moving parts of your body.  You have weakness in only one body part or on only one side of the body.  You have a fever.  You have trouble speaking or swallowing.  You cannot control when you pee (urinate) or poop (bowel movement).  You have black or bloody throw up (vomit) or poop.  Your weakness gets worse or spreads to other body parts.  You have new aches or pains. MAKE SURE YOU:   Understand these instructions.  Will watch your condition.  Will get help right away if you are not doing well or get worse.   This information is not intended to replace advice given to you by your health care provider. Make sure you discuss any questions you have with your health care provider.   Document Released: 10/17/2008 Document Revised: 05/05/2012 Document Reviewed: 01/03/2012 Elsevier Interactive Patient Education [employer].

## 2016-04-01 ENCOUNTER — Emergency Department: Payer: Medicare PPO

## 2016-04-01 ENCOUNTER — Encounter: Payer: Self-pay | Admitting: Medical Oncology

## 2016-04-01 ENCOUNTER — Inpatient Hospital Stay
Admission: EM | Admit: 2016-04-01 | Discharge: 2016-04-09 | DRG: 208 | Disposition: A | Discharge status: Home Health | Payer: Medicare PPO | Attending: Internal Medicine | Admitting: Internal Medicine

## 2016-04-01 DIAGNOSIS — J9690 Respiratory failure, unspecified, unspecified whether with hypoxia or hypercapnia: Secondary | ICD-10-CM

## 2016-04-01 DIAGNOSIS — Z4659 Encounter for fitting and adjustment of other gastrointestinal appliance and device: Secondary | ICD-10-CM

## 2016-04-01 DIAGNOSIS — R509 Fever, unspecified: Secondary | ICD-10-CM

## 2016-04-01 DIAGNOSIS — Z978 Presence of other specified devices: Secondary | ICD-10-CM

## 2016-04-01 DIAGNOSIS — I10 Essential (primary) hypertension: Secondary | ICD-10-CM | POA: Diagnosis present

## 2016-04-01 DIAGNOSIS — Z79899 Other long term (current) drug therapy: Secondary | ICD-10-CM

## 2016-04-01 DIAGNOSIS — J690 Pneumonitis due to inhalation of food and vomit: Secondary | ICD-10-CM | POA: Diagnosis not present

## 2016-04-01 DIAGNOSIS — Z880 Allergy status to penicillin: Secondary | ICD-10-CM

## 2016-04-01 DIAGNOSIS — I471 Supraventricular tachycardia: Secondary | ICD-10-CM | POA: Diagnosis not present

## 2016-04-01 DIAGNOSIS — J9622 Acute and chronic respiratory failure with hypercapnia: Secondary | ICD-10-CM | POA: Insufficient documentation

## 2016-04-01 DIAGNOSIS — G9341 Metabolic encephalopathy: Secondary | ICD-10-CM | POA: Diagnosis present

## 2016-04-01 DIAGNOSIS — Z87891 Personal history of nicotine dependence: Secondary | ICD-10-CM | POA: Diagnosis not present

## 2016-04-01 DIAGNOSIS — I472 Ventricular tachycardia: Secondary | ICD-10-CM | POA: Diagnosis present

## 2016-04-01 DIAGNOSIS — R1319 Other dysphagia: Secondary | ICD-10-CM | POA: Diagnosis present

## 2016-04-01 DIAGNOSIS — E876 Hypokalemia: Secondary | ICD-10-CM | POA: Diagnosis present

## 2016-04-01 DIAGNOSIS — J189 Pneumonia, unspecified organism: Principal | ICD-10-CM

## 2016-04-01 DIAGNOSIS — F05 Delirium due to known physiological condition: Secondary | ICD-10-CM | POA: Diagnosis present

## 2016-04-01 DIAGNOSIS — R482 Apraxia: Secondary | ICD-10-CM | POA: Diagnosis present

## 2016-04-01 DIAGNOSIS — R4182 Altered mental status, unspecified: Secondary | ICD-10-CM | POA: Diagnosis not present

## 2016-04-01 DIAGNOSIS — J40 Bronchitis, not specified as acute or chronic: Secondary | ICD-10-CM

## 2016-04-01 DIAGNOSIS — Z7982 Long term (current) use of aspirin: Secondary | ICD-10-CM

## 2016-04-01 DIAGNOSIS — G309 Alzheimer's disease, unspecified: Secondary | ICD-10-CM | POA: Diagnosis present

## 2016-04-01 DIAGNOSIS — I480 Paroxysmal atrial fibrillation: Secondary | ICD-10-CM | POA: Diagnosis present

## 2016-04-01 DIAGNOSIS — R531 Weakness: Secondary | ICD-10-CM

## 2016-04-01 DIAGNOSIS — F0390 Unspecified dementia without behavioral disturbance: Secondary | ICD-10-CM | POA: Diagnosis not present

## 2016-04-01 DIAGNOSIS — G3183 Dementia with Lewy bodies: Secondary | ICD-10-CM | POA: Diagnosis present

## 2016-04-01 DIAGNOSIS — J9601 Acute respiratory failure with hypoxia: Secondary | ICD-10-CM | POA: Diagnosis present

## 2016-04-01 DIAGNOSIS — E872 Acidosis, unspecified: Secondary | ICD-10-CM

## 2016-04-01 HISTORY — DX: Parkinson's disease: G20

## 2016-04-01 HISTORY — DX: Essential (primary) hypertension: I10

## 2016-04-01 HISTORY — DX: Parkinson's disease without dyskinesia, without mention of fluctuations: G20.A1

## 2016-04-01 LAB — BLOOD GAS, VENOUS
Acid-Base Excess: 6.3 mmol/L — ABNORMAL HIGH (ref 0.0–3.0)
Bicarbonate: 30.5 mEq/L — ABNORMAL HIGH (ref 21.0–28.0)
O2 Saturation: 84.3 %
PCO2 VEN: 41 mmHg — AB (ref 44.0–60.0)
PH VEN: 7.48 — AB (ref 7.320–7.430)
Patient temperature: 37
pO2, Ven: 45 mmHg (ref 31.0–45.0)

## 2016-04-01 LAB — CBC WITH DIFFERENTIAL/PLATELET
BASOS ABS: 0 10*3/uL (ref 0–0.1)
Basophils Relative: 0 %
Eosinophils Absolute: 0 10*3/uL (ref 0–0.7)
Eosinophils Relative: 0 %
HEMATOCRIT: 42 % (ref 40.0–52.0)
HEMOGLOBIN: 14.5 g/dL (ref 13.0–18.0)
Lymphs Abs: 0.9 10*3/uL — ABNORMAL LOW (ref 1.0–3.6)
MCH: 34.6 pg — ABNORMAL HIGH (ref 26.0–34.0)
MCHC: 34.5 g/dL (ref 32.0–36.0)
MCV: 100.4 fL — AB (ref 80.0–100.0)
MONO ABS: 0.5 10*3/uL (ref 0.2–1.0)
Monocytes Relative: 5 %
NEUTROS ABS: 8.4 10*3/uL — AB (ref 1.4–6.5)
Platelets: 122 10*3/uL — ABNORMAL LOW (ref 150–440)
RBC: 4.18 MIL/uL — ABNORMAL LOW (ref 4.40–5.90)
RDW: 11.9 % (ref 11.5–14.5)
WBC: 9.9 10*3/uL (ref 3.8–10.6)

## 2016-04-01 LAB — COMPREHENSIVE METABOLIC PANEL
ALBUMIN: 4.1 g/dL (ref 3.5–5.0)
ALT: 13 U/L — ABNORMAL LOW (ref 17–63)
ANION GAP: 8 (ref 5–15)
AST: 24 U/L (ref 15–41)
Alkaline Phosphatase: 80 U/L (ref 38–126)
BUN: 18 mg/dL (ref 6–20)
CHLORIDE: 99 mmol/L — AB (ref 101–111)
CO2: 29 mmol/L (ref 22–32)
Calcium: 8.9 mg/dL (ref 8.9–10.3)
Creatinine, Ser: 0.68 mg/dL (ref 0.61–1.24)
GFR calc Af Amer: >60 mL/min (ref >60)
GLUCOSE: 251 mg/dL — AB (ref 65–99)
POTASSIUM: 3.7 mmol/L (ref 3.5–5.1)
Sodium: 136 mmol/L (ref 135–145)
Total Bilirubin: 1.2 mg/dL (ref 0.3–1.2)
Total Protein: 6.7 g/dL (ref 6.5–8.1)

## 2016-04-01 LAB — URINALYSIS COMPLETE WITH MICROSCOPIC (ARMC ONLY)
BACTERIA UA: NONE SEEN
Bilirubin Urine: NEGATIVE
HGB URINE DIPSTICK: NEGATIVE
LEUKOCYTES UA: NEGATIVE
Nitrite: NEGATIVE
PH: 7 (ref 5.0–8.0)
PROTEIN: 30 mg/dL — AB
SPECIFIC GRAVITY, URINE: 1.022 (ref 1.005–1.030)
SQUAMOUS EPITHELIAL / LPF: NONE SEEN

## 2016-04-01 LAB — TROPONIN I

## 2016-04-01 LAB — LACTIC ACID, PLASMA
LACTIC ACID, VENOUS: 2.1 mmol/L — AB (ref 0.5–2.0)
LACTIC ACID, VENOUS: 1.6 mmol/L (ref 0.5–2.0)

## 2016-04-01 MED ORDER — PRIMIDONE 50 MG PO TABS: 50.0000 mg | ORAL_TABLET | Freq: Every day | ORAL | Status: DC

## 2016-04-01 MED ORDER — SODIUM CHLORIDE 0.9 % IV SOLN
INTRAVENOUS | Status: DC
  Administered 2016-04-01: 18:00:00 via INTRAVENOUS

## 2016-04-01 MED ORDER — VITAMIN D 1000 UNITS PO TABS: 1000.0000 [IU] | ORAL_TABLET | Freq: Every day | ORAL | Status: DC

## 2016-04-01 MED ORDER — DEXTROSE 5 % IV SOLN
250.0000 mg | INTRAVENOUS | Status: DC
  Filled 2016-04-01: qty 250

## 2016-04-01 MED ORDER — FLUTICASONE PROPIONATE 50 MCG/ACT NA SUSP
1.0000 | Freq: Every day | NASAL | Status: DC
  Filled 2016-04-01: qty 16

## 2016-04-01 MED ORDER — VITAMIN B-12 1000 MCG PO TABS: 1000.0000 ug | ORAL_TABLET | Freq: Every day | ORAL | Status: DC

## 2016-04-01 MED ORDER — MEMANTINE HCL ER 14 MG PO CP24
28.0000 mg | ORAL_CAPSULE | Freq: Every day | ORAL | Status: DC
  Administered 2016-04-02: 28 mg via ORAL
  Filled 2016-04-01: qty 2
  Filled 2016-04-01: qty 1

## 2016-04-01 MED ORDER — SODIUM CHLORIDE 0.9 % IV BOLUS (SEPSIS)
1000.0000 mL | Freq: Once | INTRAVENOUS | Status: AC
  Administered 2016-04-01: 1000 mL via INTRAVENOUS

## 2016-04-01 MED ORDER — TRAMADOL HCL 50 MG PO TABS: 50.0000 mg | ORAL_TABLET | Freq: Four times a day (QID) | ORAL | Status: DC | PRN

## 2016-04-01 MED ORDER — ACETAMINOPHEN 650 MG RE SUPP
650.0000 mg | RECTAL | Status: AC
  Administered 2016-04-01: 18:00:00 650 mg via RECTAL
  Filled 2016-04-01: qty 1

## 2016-04-01 MED ORDER — ASPIRIN EC 81 MG PO TBEC: 81.0000 mg | DELAYED_RELEASE_TABLET | Freq: Every day | ORAL | Status: DC

## 2016-04-01 MED ORDER — PRIMIDONE 50 MG PO TABS
75.0000 mg | ORAL_TABLET | ORAL | Status: DC
  Administered 2016-04-02: 75 mg via ORAL
  Filled 2016-04-01: qty 2

## 2016-04-01 MED ORDER — KETOROLAC TROMETHAMINE 30 MG/ML IJ SOLN
30.0000 mg | Freq: Once | INTRAMUSCULAR | Status: AC
  Administered 2016-04-01: 30 mg via INTRAVENOUS
  Filled 2016-04-01: qty 1

## 2016-04-01 MED ORDER — HEPARIN SODIUM (PORCINE) 5000 UNIT/ML IJ SOLN
5000.0000 [IU] | Freq: Three times a day (TID) | INTRAMUSCULAR | Status: DC
  Administered 2016-04-02: 5000 [IU] via SUBCUTANEOUS
  Filled 2016-04-01: qty 1

## 2016-04-01 MED ORDER — MEMANTINE HCL-DONEPEZIL HCL ER 28-10 MG PO CP24: 1.0000 | ORAL_CAPSULE | Freq: Every day | ORAL | Status: DC

## 2016-04-01 MED ORDER — AZTREONAM 2 G IJ SOLR
2.0000 g | Freq: Once | INTRAMUSCULAR | Status: AC
  Administered 2016-04-01: 2 g via INTRAVENOUS
  Filled 2016-04-01: qty 2

## 2016-04-01 MED ORDER — DONEPEZIL HCL 5 MG PO TABS
10.0000 mg | ORAL_TABLET | Freq: Every day | ORAL | Status: DC
  Administered 2016-04-02: 10 mg via ORAL
  Filled 2016-04-01: qty 2

## 2016-04-01 MED ORDER — ARGININE 500 MG PO CAPS: 1000.0000 mg | ORAL_CAPSULE | ORAL | Status: DC

## 2016-04-01 MED ORDER — VANCOMYCIN HCL IN DEXTROSE 1-5 GM/200ML-% IV SOLN
1000.0000 mg | Freq: Once | INTRAVENOUS | Status: AC
  Administered 2016-04-01: 1000 mg via INTRAVENOUS
  Filled 2016-04-01: qty 200

## 2016-04-01 MED ORDER — DEXTROSE 5 % IV SOLN
1.0000 g | INTRAVENOUS | Status: DC
  Administered 2016-04-01: 1 g via INTRAVENOUS
  Filled 2016-04-01 (×2): qty 10

## 2016-04-01 MED ORDER — IPRATROPIUM-ALBUTEROL 0.5-2.5 (3) MG/3ML IN SOLN
3.0000 mL | RESPIRATORY_TRACT | Status: DC
  Administered 2016-04-01 – 2016-04-02 (×4): 3 mL via RESPIRATORY_TRACT
  Filled 2016-04-01 (×4): qty 3

## 2016-04-01 MED ORDER — SODIUM CHLORIDE 0.9 % IV BOLUS (SEPSIS)
500.0000 mL | Freq: Once | INTRAVENOUS | Status: AC
  Administered 2016-04-01: 500 mL via INTRAVENOUS

## 2016-04-01 MED ORDER — LEVOFLOXACIN IN D5W 750 MG/150ML IV SOLN
750.0000 mg | Freq: Once | INTRAVENOUS | Status: AC
  Administered 2016-04-01: 750 mg via INTRAVENOUS
  Filled 2016-04-01: qty 150

## 2016-04-01 NOTE — ED Notes (Signed)
MD at bedside. 

## 2016-04-01 NOTE — ED Notes (Signed)
Pharmacy called regarding Azactam, will send to ED momentarily.

## 2016-04-01 NOTE — ED Notes (Signed)
X-ray at bedside

## 2016-04-01 NOTE — ED Provider Notes (Signed)
[facility] Emergency Department Provider Note        Time seen: ----------------------------------------- 11:09 AM on 04/01/2016 -----------------------------------------  L5 caveat: Review of systems and history is limited by dementia and altered mental status  I have reviewed the triage vital signs and the nursing notes.   HISTORY  Chief Complaint Fever; Weakness; and Nasal Congestion    HPI Jonathan Bean is a 82 y.o. male who presents to ER for fever, weakness and congestion. Patient is brought in by EMS at the request of his daughter states he is acting abnormally and is started running a fever with cough and congestion. No further information is available this time   Past Medical History  Diagnosis Date  . Alzheimer's dementia   . Apraxia   . Lewy body dementia     Patient Active Problem List   Diagnosis Date Noted  . Alzheimer's disease 02/09/2015  . Lewy body dementia 02/09/2015  . Apraxia 02/09/2015    No past surgical history on file.  Allergies Penicillins  Social History Social History  Substance Use Topics  . Smoking status: Former Smoker    Types: Pipe    Quit date: 11/19/1989  . Smokeless tobacco: Not on file  . Alcohol Use: Not on file    Review of Systems Constitutional: Positive for fever ENT: Positive for congestion Respiratory: Positive for cough Neurological: Positive for weakness  review of systems otherwise unknown at this time ____________________________________________   PHYSICAL EXAM:  VITAL SIGNS: ED Triage Vitals  Enc Vitals Group     BP --      Pulse --      Resp --      Temp --      Temp src --      SpO2 --      Weight --      Height --      Head Cir --      Peak Flow --      Pain Score --      Pain Loc --      Pain Edu? --      Excl. in [place]? --     Constitutional: Lethargic, disoriented, no acute distress  Eyes: Conjunctivae are normal. PERRL. Normal extraocular  movements. ENT   Head: Normocephalic and atraumatic.   Nose: No congestion/rhinnorhea.   Mouth/Throat: Mucous membranes are moist.   Neck: No stridor. Cardiovascular: Normal rate, regular rhythm. No murmurs, rubs, or gallops. Respiratory: Normal respiratory effort without tachypnea nor retractions. Breath sounds are clear and equal bilaterally. No wheezes/rales/rhonchi. Gastrointestinal: Soft and nontender. Normal bowel sounds Musculoskeletal: Nontender with normal range of motion in all extremities. No lower extremity tenderness nor edema. Neurologic:  Normal speech and language. No gross focal neurologic deficits are appreciated.  Skin:  Skin is warm, dry and intact. No rash noted. Psychiatric: Mood and affect are normal. Speech and behavior are normal.  ____________________________________________  ED COURSE:  Pertinent labs & imaging results that were available during my care of the patient were reviewed by me and considered in my medical decision making (see chart for details). Patient presents with fever and altered mental status with cough and congestion. We'll activate code sepsis protocol. ____________________________________________    LABS (pertinent positives/negatives)  Labs Reviewed  LACTIC ACID, PLASMA - Abnormal; Notable for the following:    Lactic Acid, Venous 2.1 (*)    All other components within normal limits  COMPREHENSIVE METABOLIC PANEL - Abnormal; Notable for the following:  Chloride 99 (*)    Glucose, Bld 251 (*)    ALT 13 (*)    All other components within normal limits  CBC WITH DIFFERENTIAL/PLATELET - Abnormal; Notable for the following:    RBC 4.18 (*)    MCV 100.4 (*)    MCH 34.6 (*)    Platelets 122 (*)    Neutro Abs 8.4 (*)    Lymphs Abs 0.9 (*)    All other components within normal limits  URINALYSIS COMPLETEWITH MICROSCOPIC ([facility] ONLY) - Abnormal; Notable for the following:    Color, Urine YELLOW (*)    APPearance CLEAR (*)     Glucose, UA >500 (*)    Ketones, ur 1+ (*)    Protein, ur 30 (*)    All other components within normal limits  BLOOD GAS, VENOUS - Abnormal; Notable for the following:    pH, Ven 7.48 (*)    pCO2, Ven 41 (*)    Bicarbonate 30.5 (*)    Acid-Base Excess 6.3 (*)    All other components within normal limits  CULTURE, BLOOD (ROUTINE X 2)  CULTURE, BLOOD (ROUTINE X 2)  URINE CULTURE  TROPONIN I  LACTIC ACID, PLASMA   CRITICAL CARE Performed by: Earleen Newport   Total critical care time: 30 minutes  Critical care time was exclusive of separately billable procedures and treating other patients.  Critical care was necessary to treat or prevent imminent or life-threatening deterioration.  Critical care was time spent personally by me on the following activities: development of treatment plan with patient and/or surrogate as well as nursing, discussions with consultants, evaluation of patient's response to treatment, examination of patient, obtaining history from patient or surrogate, ordering and performing treatments and interventions, ordering and review of laboratory studies, ordering and review of radiographic studies, pulse oximetry and re-evaluation of patient's condition.  RADIOLOGY Images were viewed by me  Chest x-ray  ____________________________________________  FINAL ASSESSMENT AND PLAN  Fever, weakness, lactic acidosis, cough  Plan: Patient with labs and imaging as dictated above. Patient being treated for pneumonia at this time. We have started sepsis protocol however his blood pressure and heart rate seems stable this time. I will discuss with the hospitalist for admission.   Earleen Newport, MD   Note: This dictation was prepared with Dragon dictation. Any transcriptional errors that result from this process are unintentional   Earleen Newport, MD 04/01/16 1222

## 2016-04-01 NOTE — Progress Notes (Signed)
PHARMACIST - PHYSICIAN ORDER COMMUNICATION  CONCERNING: P&T Medication Policy on Herbal Medications  DESCRIPTION:  This patient's order for:  Arginine  has been noted.  This product(s) is classified as an "herbal" or natural product. Due to a lack of definitive safety studies or FDA approval, nonstandard manufacturing practices, plus the potential risk of unknown drug-drug interactions while on inpatient medications, the Pharmacy and Therapeutics Committee does not permit the use of "herbal" or natural products of this type within [facility].   ACTION TAKEN: The pharmacy department is unable to verify this order at this time and your patient has been informed of this safety policy. Please reevaluate patient's clinical condition at discharge and address if the herbal or natural product(s) should be resumed at that time.

## 2016-04-01 NOTE — ED Notes (Signed)
Pt from home via ems with reports from daughter that pt has had fever, congestion and lethargy. Pt has hx of dementia and poor historian. Pt denies pain.

## 2016-04-01 NOTE — H&P (Signed)
[place] [facility] [place] NAME: Jonathan Bean    MR#:  [id number]  DATE OF BIRTH:  06/29/1926  DATE OF ADMISSION:  04/01/2016  PRIMARY CARE PHYSICIAN: BABAOFF, Caryl Bis, MD   REQUESTING/REFERRING PHYSICIAN: Williams  CHIEF COMPLAINT:   Chief Complaint  Patient presents with  . Fever  . Weakness  . Nasal Congestion    HISTORY OF PRESENT ILLNESS: Jonathan Bean  is a 82 y.o. male with a known history of Present illness dementia, and a body dementia, Parkinson's disease, hypertension-lives with daughter, for last 2 days had some congestion and cough and gradually became more drowsy. He also had some fever last night and nonproductive cough and concerned with his daughter brought him to the emergency room. In ER he was noted to have fever up to 101F, and some findings of atelectasis or pneumonia on chest x-ray so given broad-spectrum antibiotics and given to hospitalist team for admission.  Patient is drowsy, easily arousable, but confused so not able to give me any history, and all these details obtained from his daughter on the phone.   PAST MEDICAL HISTORY:   Past Medical History  Diagnosis Date  . Alzheimer's dementia   . Apraxia   . Lewy body dementia   . Parkinson's disease ([place])   . Hypertension     PAST SURGICAL HISTORY: History reviewed. No pertinent past surgical history.  SOCIAL HISTORY:  Social History  Substance Use Topics  . Smoking status: Former Smoker    Types: Pipe    Quit date: 11/19/1989  . Smokeless tobacco: Not on file  . Alcohol Use: Not on file    FAMILY HISTORY:  Family History  Problem Relation Age of Onset  . Dementia Mother     DRUG ALLERGIES:  Allergies  Allergen Reactions  . Penicillins Other (See Comments)    Daughter states deadly    REVIEW OF SYSTEMS:   Because of drowsiness and confusion patient is not able to give a review of system.  MEDICATIONS AT HOME:  Prior to Admission  medications   Medication Sig Start Date End Date Taking? Authorizing Provider  Arginine 500 MG CAPS Take 1,000 mg by mouth every morning.   Yes Historical Provider, MD  aspirin EC 81 MG tablet Take 81 mg by mouth daily.   Yes Historical Provider, MD  cholecalciferol (VITAMIN D) 1000 UNITS tablet Take 1,000 Units by mouth daily.   Yes Historical Provider, MD  fluconazole (DIFLUCAN) 100 MG tablet Take 100 mg by mouth once a week.   Yes Historical Provider, MD  fluticasone (FLONASE) 50 MCG/ACT nasal spray Place 1 spray into both nostrils daily.   Yes Historical Provider, MD  Memantine HCl-Donepezil HCl (NAMZARIC) 28-10 MG CP24 Take 1 tablet by mouth at bedtime.   Yes Historical Provider, MD  primidone (MYSOLINE) 50 MG tablet Take 75 mg by mouth every morning.    Yes Historical Provider, MD  primidone (MYSOLINE) 50 MG tablet Take 50 mg by mouth at bedtime.   Yes Historical Provider, MD  vitamin B-12 (CYANOCOBALAMIN) 1000 MCG tablet Take 1,000 mcg by mouth daily.   Yes Historical Provider, MD  traMADol (ULTRAM) 50 MG tablet Take 50 mg by mouth daily as needed.    Historical Provider, MD      PHYSICAL EXAMINATION:   VITAL SIGNS: Blood pressure 134/74, pulse 77, temperature 101.6 F (38.7 C), temperature source Rectal, resp. rate 22, height 5\' 7"  (1.702 m), weight 73.029 kg (161 lb),  SpO2 99 %.  GENERAL:  82 y.o.-year-old patient lying in the bed with no acute distress.  EYES: Pupils equal, round, reactive to light and accommodation. No scleral icterus. Extraocular muscles intact.  HEENT: Head atraumatic, normocephalic. Oropharynx and nasopharynx clear.  NECK:  Supple, no jugular venous distention. No thyroid enlargement, no tenderness.  LUNGS: Normal breath sounds bilaterally, no wheezing, Some crepitation. No use of accessory muscles of respiration.  CARDIOVASCULAR: S1, S2 normal. No murmurs, rubs, or gallops.  ABDOMEN: Soft, nontender, nondistended. Bowel sounds present. No organomegaly or  mass.  EXTREMITIES: No pedal edema, cyanosis, or clubbing.  NEUROLOGIC: Patient is drowsy, easily arousable, he moves limbs to stimuli, but very confused and not able to carry out and communications.  PSYCHIATRIC: The patient isdrowsy and confused.  SKIN: No obvious rash, lesion, or ulcer.   LABORATORY PANEL:   CBC  Recent Labs Lab 04/01/16 1112  WBC 9.9  HGB 14.5  HCT 42.0  PLT 122*  MCV 100.4*  MCH 34.6*  MCHC 34.5  RDW 11.9  LYMPHSABS 0.9*  MONOABS 0.5  EOSABS 0.0  BASOSABS 0.0   ------------------------------------------------------------------------------------------------------------------  Chemistries   Recent Labs Lab 04/01/16 1112  NA 136  K 3.7  CL 99*  CO2 29  GLUCOSE 251*  BUN 18  CREATININE 0.68  CALCIUM 8.9  AST 24  ALT 13*  ALKPHOS 80  BILITOT 1.2   ------------------------------------------------------------------------------------------------------------------ estimated creatinine clearance is 60.8 mL/min (by C-G formula based on Cr of 0.68). ------------------------------------------------------------------------------------------------------------------ No results for input(s): TSH, T4TOTAL, T3FREE, THYROIDAB in the last 72 hours.  Invalid input(s): FREET3   Coagulation profile No results for input(s): INR, PROTIME in the last 168 hours. ------------------------------------------------------------------------------------------------------------------- No results for input(s): DDIMER in the last 72 hours. -------------------------------------------------------------------------------------------------------------------  Cardiac Enzymes  Recent Labs Lab 04/01/16 1112  TROPONINI <0.03   ------------------------------------------------------------------------------------------------------------------ Invalid input(s):  POCBNP  ---------------------------------------------------------------------------------------------------------------  Urinalysis    Component Value Date/Time   COLORURINE YELLOW* 04/01/2016 1112   COLORURINE Yellow 11/09/2013 1740   APPEARANCEUR CLEAR* 04/01/2016 1112   APPEARANCEUR Clear 11/09/2013 1740   LABSPEC 1.022 04/01/2016 1112   LABSPEC 1.018 11/09/2013 1740   PHURINE 7.0 04/01/2016 1112   PHURINE 5.0 11/09/2013 1740   GLUCOSEU >500* 04/01/2016 1112   GLUCOSEU Negative 11/09/2013 1740   HGBUR NEGATIVE 04/01/2016 1112   HGBUR 1+ 11/09/2013 1740   BILIRUBINUR NEGATIVE 04/01/2016 1112   BILIRUBINUR Negative 11/09/2013 1740   KETONESUR 1+* 04/01/2016 1112   KETONESUR Negative 11/09/2013 1740   PROTEINUR 30* 04/01/2016 1112   PROTEINUR 30 mg/dL 11/09/2013 1740   NITRITE NEGATIVE 04/01/2016 1112   NITRITE Negative 11/09/2013 1740   LEUKOCYTESUR NEGATIVE 04/01/2016 1112   LEUKOCYTESUR Negative 11/09/2013 1740     RADIOLOGY: Dg Chest Port 1 View  04/01/2016  CLINICAL DATA:  Confusion and lethargy. History of dementia. Poor historian. EXAM: PORTABLE CHEST 1 VIEW COMPARISON:  02/01/2016; 11/14/2013 FINDINGS: Grossly unchanged enlarged cardiac silhouette and mediastinal contours given reduced lung volumes and patient rotation. Minimal bibasilar heterogeneous opacities favored to represent atelectasis. Pulmonary is congestion without frank evidence of edema. No pleural effusion or pneumothorax. No acute osseous abnormalities. IMPRESSION: Cardiomegaly and bibasilar atelectasis without acute cardiopulmonary disease on this hypoventilated AP portable examination. Further evaluation with a PA and lateral chest radiograph may be obtained as clinically indicated. Electronically Signed   By: Sandi Mariscal M.D.   On: 04/01/2016 12:05    EKG: Orders placed or performed during the hospital encounter of 04/01/16  . ED EKG 12-Lead  .  ED EKG 12-Lead    IMPRESSION AND PLAN:  *  Pneumonia  We'll give IV Rocephin and his Sumycin.  Repeat chest x-ray tomorrow to check for improvement.  * Altered mental status   Metabolic encephalopathy due to infection.   Continue monitoring with treatment of pneumonia.  * Parkinson's disease and Alzheimer's dementia Continue home medications.  * Generalized weakness  We'll get physical therapy evaluation for him.    All the records are reviewed and case discussed with ED provider. Management plans discussed with the patient, family and they are in agreement.  CODE STATUS: full code  Code Status History    This patient does not have a recorded code status. Please follow your organizational policy for patients in this situation.     I spoke to patient's daughter who is power of attorney on phone.   TOTAL TIME TAKING CARE OF THIS PATIENT: 50  minutes.    Vaughan Basta M.D on 04/01/2016   Between 7am to 6pm - Pager - [phone]  After 6pm go to [email or web address] - password [email or web address] [facility]  Office  [phone]  CC: Primary care physician; BABAOFF, Caryl Bis, MD   Note: This dictation was prepared with Dragon dictation along with smaller phrase technology. Any transcriptional errors that result from this process are unintentional.

## 2016-04-02 ENCOUNTER — Inpatient Hospital Stay: Payer: Medicare PPO

## 2016-04-02 DIAGNOSIS — J9622 Acute and chronic respiratory failure with hypercapnia: Secondary | ICD-10-CM

## 2016-04-02 DIAGNOSIS — J690 Pneumonitis due to inhalation of food and vomit: Secondary | ICD-10-CM

## 2016-04-02 LAB — BLOOD GAS, ARTERIAL
ACID-BASE DEFICIT: 5.3 mmol/L — AB (ref 0.0–2.0)
Allens test (pass/fail): POSITIVE — AB
BICARBONATE: 19.2 meq/L — AB (ref 21.0–28.0)
FIO2: 1
O2 Saturation: 98.5 %
PATIENT TEMPERATURE: 37
PO2 ART: 119 mmHg — AB (ref 83.0–108.0)
pCO2 arterial: 34 mmHg (ref 32.0–48.0)
pH, Arterial: 7.36 (ref 7.350–7.450)

## 2016-04-02 LAB — BASIC METABOLIC PANEL
ANION GAP: 13 (ref 5–15)
BUN: 16 mg/dL (ref 6–20)
CALCIUM: 8.4 mg/dL — AB (ref 8.9–10.3)
CO2: 25 mmol/L (ref 22–32)
CREATININE: 0.77 mg/dL (ref 0.61–1.24)
Chloride: 101 mmol/L (ref 101–111)
GFR calc Af Amer: >60 mL/min (ref >60)
GLUCOSE: 193 mg/dL — AB (ref 65–99)
Potassium: 3.2 mmol/L — ABNORMAL LOW (ref 3.5–5.1)
Sodium: 139 mmol/L (ref 135–145)

## 2016-04-02 LAB — GLUCOSE, CAPILLARY
Glucose-Capillary: 185 mg/dL — ABNORMAL HIGH (ref 65–99)
Glucose-Capillary: 172 mg/dL — ABNORMAL HIGH (ref 65–99)

## 2016-04-02 LAB — MRSA PCR SCREENING: MRSA by PCR: NEGATIVE

## 2016-04-02 LAB — CBC
HCT: 42.6 % (ref 40.0–52.0)
Hemoglobin: 14.5 g/dL (ref 13.0–18.0)
MCH: 34.2 pg — AB (ref 26.0–34.0)
MCHC: 34 g/dL (ref 32.0–36.0)
MCV: 100.7 fL — ABNORMAL HIGH (ref 80.0–100.0)
PLATELETS: 108 10*3/uL — AB (ref 150–440)
RBC: 4.24 MIL/uL — ABNORMAL LOW (ref 4.40–5.90)
RDW: 12.3 % (ref 11.5–14.5)
WBC: 6.7 10*3/uL (ref 3.8–10.6)

## 2016-04-02 LAB — URINE CULTURE: CULTURE: NO GROWTH

## 2016-04-02 LAB — MAGNESIUM: Magnesium: 1.6 mg/dL — ABNORMAL LOW (ref 1.7–2.4)

## 2016-04-02 LAB — PROCALCITONIN: PROCALCITONIN: 7.9 ng/mL

## 2016-04-02 LAB — TRIGLYCERIDES: TRIGLYCERIDES: 80 mg/dL (ref <150)

## 2016-04-02 LAB — BRAIN NATRIURETIC PEPTIDE: B Natriuretic Peptide: 306 pg/mL — ABNORMAL HIGH (ref 0.0–100.0)

## 2016-04-02 MED ORDER — POTASSIUM CHLORIDE 20 MEQ/15ML (10%) PO SOLN
30.0000 meq | ORAL | Status: AC
  Administered 2016-04-02 (×2): 30 meq
  Filled 2016-04-02 (×2): qty 30

## 2016-04-02 MED ORDER — ASPIRIN 81 MG PO CHEW
81.0000 mg | CHEWABLE_TABLET | Freq: Every day | ORAL | Status: DC
  Administered 2016-04-02: 81 mg
  Filled 2016-04-02: qty 1

## 2016-04-02 MED ORDER — ENOXAPARIN SODIUM 40 MG/0.4ML ~~LOC~~ SOLN
40.0000 mg | SUBCUTANEOUS | Status: DC
  Administered 2016-04-02 – 2016-04-08 (×7): 40 mg via SUBCUTANEOUS
  Filled 2016-04-02 (×7): qty 0.4

## 2016-04-02 MED ORDER — MIDAZOLAM HCL 2 MG/2ML IJ SOLN
INTRAMUSCULAR | Status: AC
  Administered 2016-04-02: 4 mg via INTRAVENOUS
  Filled 2016-04-02: qty 4

## 2016-04-02 MED ORDER — ETOMIDATE 2 MG/ML IV SOLN
20.0000 mg | Freq: Once | INTRAVENOUS | Status: AC
  Administered 2016-04-02: 20 mg via INTRAVENOUS
  Filled 2016-04-02: qty 10

## 2016-04-02 MED ORDER — METOPROLOL TARTRATE 5 MG/5ML IV SOLN
INTRAVENOUS | Status: AC
  Administered 2016-04-02: 2.5 mg via INTRAVENOUS
  Filled 2016-04-02: qty 5

## 2016-04-02 MED ORDER — DILTIAZEM HCL 25 MG/5ML IV SOLN
INTRAVENOUS | Status: AC
  Administered 2016-04-02: 10 mg via INTRAVENOUS
  Filled 2016-04-02: qty 5

## 2016-04-02 MED ORDER — FENTANYL CITRATE (PF) 100 MCG/2ML IJ SOLN
25.0000 ug | INTRAMUSCULAR | Status: DC | PRN
  Administered 2016-04-02: 50 ug via INTRAVENOUS
  Filled 2016-04-02: qty 2

## 2016-04-02 MED ORDER — POTASSIUM CHLORIDE 20 MEQ/15ML (10%) PO SOLN
30.0000 meq | ORAL | Status: DC
  Administered 2016-04-02: 30 meq
  Filled 2016-04-02: qty 30

## 2016-04-02 MED ORDER — CHLORHEXIDINE GLUCONATE 0.12% ORAL RINSE (MEDLINE KIT)
15.0000 mL | Freq: Two times a day (BID) | OROMUCOSAL | Status: DC
  Administered 2016-04-02 – 2016-04-04 (×4): 15 mL via OROMUCOSAL
  Filled 2016-04-02 (×8): qty 15

## 2016-04-02 MED ORDER — FENTANYL 2500MCG IN NS 250ML (10MCG/ML) PREMIX INFUSION: 10.0000 ug/h | INTRAVENOUS | Status: DC

## 2016-04-02 MED ORDER — PRIMIDONE 50 MG PO TABS
50.0000 mg | ORAL_TABLET | Freq: Every day | ORAL | Status: DC
  Administered 2016-04-02 – 2016-04-07 (×4): 50 mg
  Filled 2016-04-02 (×4): qty 1

## 2016-04-02 MED ORDER — FENTANYL CITRATE (PF) 100 MCG/2ML IJ SOLN
INTRAMUSCULAR | Status: AC
  Administered 2016-04-02: 50 ug via INTRAVENOUS
  Filled 2016-04-02: qty 2

## 2016-04-02 MED ORDER — METOPROLOL TARTRATE 25 MG PO TABS
25.0000 mg | ORAL_TABLET | Freq: Two times a day (BID) | ORAL | Status: DC
  Administered 2016-04-02: 25 mg
  Filled 2016-04-02 (×2): qty 1

## 2016-04-02 MED ORDER — MIDAZOLAM HCL 5 MG/5ML IJ SOLN
4.0000 mg | Freq: Once | INTRAMUSCULAR | Status: AC
  Administered 2016-04-02: 4 mg via INTRAVENOUS

## 2016-04-02 MED ORDER — IPRATROPIUM-ALBUTEROL 0.5-2.5 (3) MG/3ML IN SOLN
3.0000 mL | RESPIRATORY_TRACT | Status: DC | PRN
  Filled 2016-04-02: qty 3

## 2016-04-02 MED ORDER — FAMOTIDINE IN NACL 20-0.9 MG/50ML-% IV SOLN
20.0000 mg | Freq: Two times a day (BID) | INTRAVENOUS | Status: DC
  Administered 2016-04-02 – 2016-04-05 (×8): 20 mg via INTRAVENOUS
  Filled 2016-04-02 (×9): qty 50

## 2016-04-02 MED ORDER — METOPROLOL TARTRATE 5 MG/5ML IV SOLN
2.5000 mg | INTRAVENOUS | Status: DC | PRN
  Administered 2016-04-02 – 2016-04-03 (×2): 2.5 mg via INTRAVENOUS
  Filled 2016-04-02 (×2): qty 5

## 2016-04-02 MED ORDER — MAGNESIUM SULFATE 2 GM/50ML IV SOLN
2.0000 g | Freq: Once | INTRAVENOUS | Status: AC
  Administered 2016-04-02: 2 g via INTRAVENOUS
  Filled 2016-04-02: qty 50

## 2016-04-02 MED ORDER — PRIMIDONE 50 MG PO TABS
75.0000 mg | ORAL_TABLET | ORAL | Status: DC
  Administered 2016-04-03: 75 mg
  Filled 2016-04-02: qty 2

## 2016-04-02 MED ORDER — VITAMIN B-12 1000 MCG PO TABS
1000.0000 ug | ORAL_TABLET | Freq: Every day | ORAL | Status: DC
  Administered 2016-04-02: 1000 ug
  Filled 2016-04-02: qty 1

## 2016-04-02 MED ORDER — SODIUM CHLORIDE 0.9 % IV SOLN
1.0000 g | Freq: Three times a day (TID) | INTRAVENOUS | Status: DC
  Administered 2016-04-02 – 2016-04-05 (×10): 1 g via INTRAVENOUS
  Filled 2016-04-02 (×12): qty 1

## 2016-04-02 MED ORDER — DILTIAZEM HCL 25 MG/5ML IV SOLN
10.0000 mg | Freq: Once | INTRAVENOUS | Status: AC
  Administered 2016-04-02: 10 mg via INTRAVENOUS

## 2016-04-02 MED ORDER — ANTISEPTIC ORAL RINSE SOLUTION (CORINZ)
7.0000 mL | OROMUCOSAL | Status: DC
  Administered 2016-04-02 – 2016-04-05 (×26): 7 mL via OROMUCOSAL
  Filled 2016-04-02 (×40): qty 7

## 2016-04-02 MED ORDER — PROPOFOL 1000 MG/100ML IV EMUL
INTRAVENOUS | Status: AC
  Administered 2016-04-02: 10 ug/kg/min via INTRAVENOUS
  Filled 2016-04-02: qty 100

## 2016-04-02 MED ORDER — VITAL HIGH PROTEIN PO LIQD
1000.0000 mL | ORAL | Status: DC
  Administered 2016-04-02 (×2): 1000 mL

## 2016-04-02 MED ORDER — FENTANYL CITRATE (PF) 100 MCG/2ML IJ SOLN
50.0000 ug | Freq: Once | INTRAMUSCULAR | Status: AC
  Administered 2016-04-02: 50 ug via INTRAVENOUS

## 2016-04-02 MED ORDER — VANCOMYCIN HCL 10 G IV SOLR
1250.0000 mg | INTRAVENOUS | Status: DC
  Administered 2016-04-02: 1250 mg via INTRAVENOUS
  Filled 2016-04-02 (×2): qty 1250

## 2016-04-02 MED ORDER — PROPOFOL 1000 MG/100ML IV EMUL
5.0000 ug/kg/min | INTRAVENOUS | Status: DC
  Administered 2016-04-02: 10 ug/kg/min via INTRAVENOUS
  Administered 2016-04-02 – 2016-04-03 (×4): 30 ug/kg/min via INTRAVENOUS
  Filled 2016-04-02 (×4): qty 100

## 2016-04-02 NOTE — Progress Notes (Signed)
PT Cancellation Note  Patient Details Name: Jonathan Bean MRN: [id number] DOB: 03-23-1926   Cancelled Treatment:    Reason Eval/Treat Not Completed: Other (comment). Pt now transferred to CCU and is on ventilator. Will need new orders to resume therapy when appropriate. Will dc current orders.   Inesha Sow 04/02/2016, 9:53 AM  Greggory Stallion, PT, DPT [phone]

## 2016-04-02 NOTE — Progress Notes (Signed)
Informed Dr. Jannifer Franklin of patients decline. Patient was 80% on 3 liter nasal cannula.Ronchi throughout lung fields.Patient placed on NRB mask. ABG will be attempted but patient has tremors. Patient is going to be transferred to CCU. Patient does not have a productive cough and airway protection is a concern.

## 2016-04-02 NOTE — Progress Notes (Signed)
Brief Nutrition Note  Consult received for enteral/tube feeding initiation and management.  Adult Enteral Nutrition Protocol initiated. Full assessment to follow.  Admitting Dx: Lactic acidosis [E87.2] Pneumonia [J18.9] Weakness [R53.1] Bronchitis [J40] Fever, unspecified fever cause [R50.9]  Body mass index is 25.21 kg/(m^2).   Labs:   Recent Labs Lab 04/01/16 1112 04/02/16 0103  NA 136 139  K 3.7 3.2*  CL 99* 101  CO2 29 25  BUN 18 16  CREATININE 0.68 0.77  CALCIUM 8.9 8.4*  MG  --  1.6*  GLUCOSE 251* 193*    [address] MS, RD, LDN [phone] Pager  [phone] Weekend/On-Call Pager

## 2016-04-02 NOTE — Progress Notes (Signed)
NT suctioned patient, Moderate white yellow secretions obtained. Helped patients breathing status

## 2016-04-02 NOTE — Progress Notes (Signed)
eLink Physician-Brief Progress Note Patient Name: Jonathan Bean DOB: 1926-01-18 MRN: [id number]   Date of Service  04/02/2016  HPI/Events of Note   Dg Abd 1 View  04/02/2016  CLINICAL DATA:  OG tube placement EXAM: ABDOMEN - 1 VIEW COMPARISON:  11/13/2013 FINDINGS: Enteric tube tip is present in the left upper quadrant consistent with location in the upper stomach. Proximal side hole is positioned just distal to the expected location of the EG junction. Visualized bowel gas pattern is normal with scattered gas-filled nondistended small and large bowel. IMPRESSION: Enteric tube tip localizes to the left upper quadrant consistent with location in the upper stomach. Electronically Signed   By: Lucienne Capers M.D.   On: 04/02/2016 03:54   Portable Chest Xray  04/02/2016  CLINICAL DATA:  Intubation. EXAM: PORTABLE CHEST 1 VIEW COMPARISON:  04/02/2016 FINDINGS: Interval placement of an endotracheal tube with tip measuring 2.9 cm above the carina. An enteric tube is been placed. The tip is off the field of view but is below the left hemidiaphragm. Shallow inspiration. Mild cardiac enlargement. No pulmonary vascular congestion. Atelectasis or infiltration again demonstrated in the right middle lobe. No blunting of costophrenic angles. No pneumothorax. Calcified and tortuous aorta. IMPRESSION: Appliances appear in satisfactory position. Persistent infiltration or atelectasis in the right middle lung. Electronically Signed   By: Lucienne Capers M.D.   On: 04/02/2016 03:53   Dg Chest Port 1 View  04/02/2016  CLINICAL DATA:  82 year old male with acute on chronic respiratory favor EXAM: PORTABLE CHEST 1 VIEW COMPARISON:  Chest radiograph dated 04/01/2016 FINDINGS: Single portable view of the chest demonstrates emphysematous changes of the lungs. An area of increased density in the right middle lobe may represent atelectatic changes or developing pneumonia. The left lung is clear. No pleural effusion or  pneumothorax. Stable cardiac silhouette. The aorta appears tortuous. There is osteopenia with degenerative changes of the spine. No acute osseous pathology. IMPRESSION: Right middle lobe atelectasis versus developing pneumonia. Clinical correlation follow-up recommended. Electronically Signed   By: Anner Crete M.D.   On: 04/02/2016 01:54   Dg Chest Port 1 View  04/01/2016  CLINICAL DATA:  Confusion and lethargy. History of dementia. Poor historian. EXAM: PORTABLE CHEST 1 VIEW COMPARISON:  02/01/2016; 11/14/2013 FINDINGS: Grossly unchanged enlarged cardiac silhouette and mediastinal contours given reduced lung volumes and patient rotation. Minimal bibasilar heterogeneous opacities favored to represent atelectasis. Pulmonary is congestion without frank evidence of edema. No pleural effusion or pneumothorax. No acute osseous abnormalities. IMPRESSION: Cardiomegaly and bibasilar atelectasis without acute cardiopulmonary disease on this hypoventilated AP portable examination. Further evaluation with a PA and lateral chest radiograph may be obtained as clinically indicated. Electronically Signed   By: Sandi Mariscal M.D.   On: 04/01/2016 12:05     eICU Interventions  Et tube is ok     Intervention Category Intermediate Interventions: Diagnostic test evaluation  Jonathan Bean 04/02/2016, 4:11 AM

## 2016-04-02 NOTE — Progress Notes (Signed)
Pharmacy Antibiotic Note  Jonathan Bean is a 82 y.o. male admitted on 04/01/2016 with pneumonia/sepsis.  Pharmacy has been consulted for vancomycin and meropenem dosing.  Plan: DW 73kg  Vd 51L kei 0.055 hr-1  T1/2 13 hours Vancomycin 1250 mg q 18 hours ordered. Patient is past interval for stacked dosing. Level before 5th overall dose. Goal trough 15-20.  Meropenem 1 gram q 8 hours ordered.  Height: 5\' 7"  (170.2 cm) Weight: 161 lb (73.029 kg) IBW/kg (Calculated) : 66.1  Temp (24hrs), Avg:101.9 F (38.8 C), Min:99.2 F (37.3 C), Max:103.9 F (39.9 C)   Recent Labs Lab 04/01/16 1112 04/01/16 1527 04/02/16 0103  WBC 9.9  --  6.7  CREATININE 0.68  --  0.77  LATICACIDVEN 2.1* 1.6  --     Estimated Creatinine Clearance: 60.8 mL/min (by C-G formula based on Cr of 0.77).    Allergies  Allergen Reactions  . Penicillins Other (See Comments)    Daughter states deadly    Antimicrobials this admission: vancomycin  >>  meropenem  >>   Dose adjustments this admission:   Microbiology results: 5/15 BCx: pending 5/15 UCx: pending  5/16 MRSA PCR: pending  5/15 UA: (-) 5/16 CXR: pending   Thank you for allowing pharmacy to be a part of this patient's care.  Levell Tavano S 04/02/2016 1:56 AM

## 2016-04-02 NOTE — Progress Notes (Signed)
Called early in shift about patient for respiratory decline.  Transferred to ICU, ABG was ok, but pt had increased work of breathing.  Consulted intensivist who recommended intubation.  Spoke with family to reaffirm code status, and decision was made to intubate.  Initially informed that on call intensivist would be called in, but was subsequently called back that respiratory would intubate under hospitalist supervision.  Pt given 30 mg etomidate, 75 mg fentanyl, 4 mg versed, and propofol bolus but still unable to fully sedate for intubation.  Vitals remained stable.  Intensivist to be called in for difficult intubation.  Jacqulyn Bath [facility] 04/02/2016, 2:27 AM

## 2016-04-02 NOTE — Consult Note (Signed)
PULMONARY / CRITICAL CARE MEDICINE   Name: Jonathan Bean MRN: [id number] DOB: [birth year]    ADMISSION DATE:  04/01/2016 CONSULTATION DATE:  04/02/16  REFERRING MD: Phillip Heal  CHIEF COMPLAINT: Respiratory distress, increased work of breathing  HISTORY OF PRESENT ILLNESS:   Jonathan, Bean is a 82 year old male with a known history of dementia, Alzheimer's disease, Parkinson's disease, hypertension, former smoker, doing body dementia. Patient presented to ED on 5/15 with his daughter with fever, congestion, lethargy. Patient was suspected to have pneumonia and was started on IV Rocephin and Sumycin. On 5/15 the patient was having some respiratory decline he was satting 80% on 3 L of nasal cannula. Rhonchi was present. Patient was placed on NRB and was moved to intensive care unit. Patient had a low-grade temp of 99.2, he was rhonchorous on auscultation therefore received a breathing treatment. His heart rate was elevated and he was in V. tach/A. Fib. Patient continued to desat and was not responding to treatment therefore needed to be intubated patient was a difficult intubation, with 3 attempts of failed intubation by RTtherefore PCCM team was called was intubated on 5/16. Therefore patient is currently intubated and on mechanical ventilation.  PAST MEDICAL HISTORY :  He  has a past medical history of Alzheimer's dementia; Apraxia; Lewy body dementia; Parkinson's disease ([place]); and Hypertension.  PAST SURGICAL HISTORY: He  has no past surgical history on file.  Allergies  Allergen Reactions  . Penicillins Other (See Comments)    Daughter states deadly    No current facility-administered medications on file prior to encounter.   Current Outpatient Prescriptions on File Prior to Encounter  Medication Sig  . aspirin EC 81 MG tablet Take 81 mg by mouth daily.  . cholecalciferol (VITAMIN D) 1000 UNITS tablet Take 1,000 Units by mouth daily.  . fluticasone (FLONASE) 50 MCG/ACT nasal  spray Place 1 spray into both nostrils daily.  . primidone (MYSOLINE) 50 MG tablet Take 75 mg by mouth every morning.   . vitamin B-12 (CYANOCOBALAMIN) 1000 MCG tablet Take 1,000 mcg by mouth daily.    FAMILY HISTORY:  His has no family status information on file.   SOCIAL HISTORY: He  reports that he quit smoking about 26 years ago. His smoking use included Pipe. He does not have any smokeless tobacco history on file.  REVIEW OF SYSTEMS:   Unable to obtain  SUBJECTIVE:  Unable to obtain  VITAL SIGNS: BP 110/67 mmHg  Pulse 102  Temp(Src) 98.3 F (36.8 C) (Rectal)  Resp 16  Ht 5\' 7"  (1.702 m)  Wt 73.029 kg (161 lb)  BMI 25.21 kg/m2  SpO2 98%  HEMODYNAMICS:    VENTILATOR SETTINGS: Vent Mode:  [-] PRVC FiO2 (%):  [28 %-100 %] 28 % Set Rate:  [15 bmp] 15 bmp Vt Set:  [500 mL] 500 mL PEEP:  [5 cmH20] 5 cmH20  INTAKE / OUTPUT: I/O last 3 completed shifts: In: 412.7 [I.V.:12.7; IV Piggyback:400] Out: 1450 [Urine:1450]  PHYSICAL EXAMINATION: General: Elderly, sickly appearing white male lightly sedated and found on the vent Neuro:  Follow simple commands HEENT: Atraumatic, normocephalic, no discharge, small sluggishly reactive pupils Cardiovascular:  S1-S2, regular rate and rhythm, no MRG noted Lungs:  Coarse throughout, very diminished right middle and lower lobe Abdomen: Soft, nontender, positive bowel sounds Musculoskeletal: no inflammation or deformity noted Skin:  grossly intact  LABS:  BMET  Recent Labs Lab 04/01/16 1112 04/02/16 0103  NA 136 139  K 3.7 3.2*  CL 99*  101  CO2 29 25  BUN 18 16  CREATININE 0.68 0.77  GLUCOSE 251* 193*    Electrolytes  Recent Labs Lab 04/01/16 1112 04/02/16 0103  CALCIUM 8.9 8.4*  MG  --  1.6*    CBC  Recent Labs Lab 04/01/16 1112 04/02/16 0103  WBC 9.9 6.7  HGB 14.5 14.5  HCT 42.0 42.6  PLT 122* 108*    Coag's No results for input(s): APTT, INR in the last 168 hours.  Sepsis Markers  Recent  Labs Lab 04/01/16 1112 04/01/16 1527  LATICACIDVEN 2.1* 1.6    ABG  Recent Labs Lab 04/02/16 0047  PHART 7.36  PCO2ART 34  PO2ART 119*    Liver Enzymes  Recent Labs Lab 04/01/16 1112  AST 24  ALT 13*  ALKPHOS 80  BILITOT 1.2  ALBUMIN 4.1    Cardiac Enzymes  Recent Labs Lab 04/01/16 1112  TROPONINI <0.03    Glucose  Recent Labs Lab 04/02/16 0104 04/02/16 0729  GLUCAP 185* 172*    Imaging Dg Abd 1 View  04/02/2016  CLINICAL DATA:  OG tube placement EXAM: ABDOMEN - 1 VIEW COMPARISON:  11/13/2013 FINDINGS: Enteric tube tip is present in the left upper quadrant consistent with location in the upper stomach. Proximal side hole is positioned just distal to the expected location of the EG junction. Visualized bowel gas pattern is normal with scattered gas-filled nondistended small and large bowel. IMPRESSION: Enteric tube tip localizes to the left upper quadrant consistent with location in the upper stomach. Electronically Signed   By: Lucienne Capers M.D.   On: 04/02/2016 03:54   Portable Chest Xray  04/02/2016  CLINICAL DATA:  Intubation. EXAM: PORTABLE CHEST 1 VIEW COMPARISON:  04/02/2016 FINDINGS: Interval placement of an endotracheal tube with tip measuring 2.9 cm above the carina. An enteric tube is been placed. The tip is off the field of view but is below the left hemidiaphragm. Shallow inspiration. Mild cardiac enlargement. No pulmonary vascular congestion. Atelectasis or infiltration again demonstrated in the right middle lobe. No blunting of costophrenic angles. No pneumothorax. Calcified and tortuous aorta. IMPRESSION: Appliances appear in satisfactory position. Persistent infiltration or atelectasis in the right middle lung. Electronically Signed   By: Lucienne Capers M.D.   On: 04/02/2016 03:53   Dg Chest Port 1 View  04/02/2016  CLINICAL DATA:  82 year old male with acute on chronic respiratory favor EXAM: PORTABLE CHEST 1 VIEW COMPARISON:  Chest  radiograph dated 04/01/2016 FINDINGS: Single portable view of the chest demonstrates emphysematous changes of the lungs. An area of increased density in the right middle lobe may represent atelectatic changes or developing pneumonia. The left lung is clear. No pleural effusion or pneumothorax. Stable cardiac silhouette. The aorta appears tortuous. There is osteopenia with degenerative changes of the spine. No acute osseous pathology. IMPRESSION: Right middle lobe atelectasis versus developing pneumonia. Clinical correlation follow-up recommended. Electronically Signed   By: Anner Crete M.D.   On: 04/02/2016 01:54   Dg Chest Port 1 View  04/01/2016  CLINICAL DATA:  Confusion and lethargy. History of dementia. Poor historian. EXAM: PORTABLE CHEST 1 VIEW COMPARISON:  02/01/2016; 11/14/2013 FINDINGS: Grossly unchanged enlarged cardiac silhouette and mediastinal contours given reduced lung volumes and patient rotation. Minimal bibasilar heterogeneous opacities favored to represent atelectasis. Pulmonary is congestion without frank evidence of edema. No pleural effusion or pneumothorax. No acute osseous abnormalities. IMPRESSION: Cardiomegaly and bibasilar atelectasis without acute cardiopulmonary disease on this hypoventilated AP portable examination. Further evaluation with a PA  and lateral chest radiograph may be obtained as clinically indicated. Electronically Signed   By: Sandi Mariscal M.D.   On: 04/01/2016 12:05     STUDIES:  None   CULTURES: 5/15. Urine culture NGTD 5/15 blood cultures>> 5/15 MRSA PCR> negative  ANTIBIOTICS: 5/15Levofloxacin> 5/15 5/15 vancomycin > 5/15  5/16 meropenem>> 5/16 Azactam SIGNIFICANT EVENTS:   LINES/TUBES: 5/16 ET tube>>  DISCUSSION: 82 year old male with a history of Alzheimer's disease, Parkinson's disease, Lewybody dementia presents with pneumonia now intubated for respiratory distress and on mechanical ventilation.   ASSESSMENT / PLAN:  PULMO Acute  hypoxemic respiratory failure Pneumonia P:   Continue vent support, wean as tolerated Routine ABGs CXR in the a.m. Meropenem/Azactam DuoNeb when necessary  CARDIOVASCULAR A:   history of hypertension Narrow complex tachycardia  P:  Continuous telemetry monitor vital signs Metoprolol twice daily and prn   RENAL A:   Hypokalemia Hypo-magnesimia P:   Replace electrolytes per ICU protocol GASTROINTESTINAL A:   No active issues P:   Start trickle tube feedings Continue Pepcid   HEMATOLOGIC A:   No active issues P:  SCDs Lovenox for DVT prophylaxis   INFECTIOUS Pneumonia P:   Antibiotics as above Monitor fever curve  ENDOCRINE A:   No active issues P:   Blood sugar checks intermittently  NEUROLOGIC A:   Metabolic encephalopathy due to infection Parkinson's disease lewy body dementia P:   RASS goal: -1 Minimize sedation medications per primary   FAMILY  - Updates:  family was updated by Dr. Alva Garnet.  - Inter-disciplinary family meet or Palliative Care meeting due by: 04/06/16  Bincy Varughese,AG-ACNP Pulmonary and [facility]'s  Pager: [phone]  04/02/2016, 9:33 AM   PCCM ATTENDING ATTESTATION: I have evaluated patient with Bincy Varughese,AG-ACNP, reviewed database in its entirety and discussed care plan in detail. In addition, this patient was discussed on multidisciplinary rounds.    CCM time: 40 mins The above time includes time spent in consultation with patient and/or family members and reviewing care plan on multidisciplinary rounds.  Merton Border, MD PCCM service Mobile [phone] Pager [phone]

## 2016-04-02 NOTE — Progress Notes (Signed)
eLink Physician-Brief Progress Note Patient Name: Jonathan Bean DOB: [birth year] MRN: [id number]   Date of Service  04/02/2016  HPI/Events of Note  tx from floor - new face mask o2 need On cam care  - paradoxical   - not responsive  - RR 20s  eICU Interventions  Needs intubation - Dr Jannifer Franklin hospitalist at [facility] will try to talk to family about code status first     Intervention Category Major Interventions: Respiratory failure - evaluation and management  Ghazal Pevey 04/02/2016, 1:08 AM

## 2016-04-02 NOTE — Progress Notes (Addendum)
[facility] ADULT ICU REPLACEMENT PROTOCOL FOR AM LAB REPLACEMENT ONLY  The patient does apply for the [facility] Adult ICU Electrolyte Replacment Protocol based on the criteria listed below:   1. Is GFR >/= 40 ml/min? Yes.    Patient's GFR today is >60 2. Is urine output >/= 0.5 ml/kg/hr for the last 6 hours? Yes.   Patient's UOP is 2.3 ml/kg/hr 3. Is BUN < 60 mg/dL? Yes.    Patient's BUN today is 16 4. Abnormal electrolyte(s):  K - 3.2, Mg - 1.6 5. Ordered repletion with: PER PROTOCOL 6. If a panic level lab has been reported, has the CCM MD in charge been notified? Yes.  .   Physician:  Dr. Ramonita Lab 04/02/2016 1:42 AM

## 2016-04-02 NOTE — Procedures (Signed)
Oral Intubation Procedure Note  Indications: Respiratory insufficiency Consent: Unable to obtain consent because of altered level of consciousness. Time Out: Verified patient identification, verified procedure, site/side was marked, verified correct patient position, special equipment/implants available, medications/allergies/relevent history reviewed, required imaging and test results available.   Pre-meds: Etomidate 20 mg IV  Neuromuscular blockade: Rocuronium 50 mg IV  Laryngoscope: #4 MAC  Visualization: cords fully visualized  ETT: 8.0 ETT passed on first attempt and secured @ 25 cm at upper incisors  Findings: normal airway   Evaluation:  Tube position confirmed by auscultation and EZCap CXR pending  Pt tolerated procedure well without complications   Merton Border, MD PCCM service Mobile [phone] Pager [phone] 04/02/2016

## 2016-04-02 NOTE — Progress Notes (Signed)
Inpatient Diabetes Program Recommendations  AACE/ADA: New Consensus Statement on Inpatient Glycemic Control (2015)  Target Ranges:  Prepandial:   less than 140 mg/dL      Peak postprandial:   less than 180 mg/dL (1-2 hours)      Critically ill patients:  140 - 180 mg/dL  Results for CODY, SAFFOLD (MRN [id number]) as of 04/02/2016 09:32  Ref. Range 04/01/2016 11:12 04/02/2016 01:03  Glucose Latest Ref Range: 65-99 mg/dL 251 (H) 193 (H)   Results for BOHDAN, PIZZINI (MRN [id number]) as of 04/02/2016 09:32  Ref. Range 11/10/2013 06:56  Hemoglobin A1C Latest Ref Range: 4.2-6.3 % 6.7 (H)   Review of Glycemic Control  Diabetes history: DM2 Outpatient Diabetes medications: None Current orders for Inpatient glycemic control: None  Inpatient Diabetes Program Recommendations: Correction (SSI): While inpatient, please consider ordering CBGs with Novolog correction scale Q4H. HgbA1C: Please consider ordering an A1C to evaluate glycemic control over the past 2-3 months.  NOTE: In reviewing the chart did not note DM on H&P. However, A1C was 6.7% on 11/10/2013 and patient was diagnosed with DM2 at that time during admission at [facility]. Noted in Care Everywhere tab that patient saw Dr. Baldemar Lenis on 08/15/2014 for DM follow up and was not on any DM medications. Initial glucose 251 mg/dl on 04/01/16. Noted patient is now intubated and will be started on tube feedings.  Thanks, Barnie Alderman, RN, MSN, CDE Diabetes Coordinator Inpatient Diabetes Program [phone] (Team Pager from [place] to [place]) [phone] ([place] office) [phone] [facility] office) [phone] [facility] office)

## 2016-04-02 NOTE — Progress Notes (Signed)
Pt with temperature during shift, cooling blanket applied and Toradol IV given x1,  Temperature decreased to 99.2. MD also notified of course breathing, Chest physiotherapy  ordered per MD. After breathing tx,  pt HR became elevated, MD called, Pt transferred to CCU per MD at 0040 due to v-tach and a-fib with elevated HR, family at bedside. Called report to CCU nurse.

## 2016-04-02 NOTE — Progress Notes (Signed)
Attempted to intubate patient, 3 attemps made without trauma. Airway in view all 3 times, patient was difficult to intubate due to sedation medications. Dr. Jannifer Franklin at bedside

## 2016-04-02 NOTE — Progress Notes (Addendum)
Chaplain rounded the unit and provided a compassionate presence and support for the family. Patient appeared to be sleeping. Minerva Fester [phone]

## 2016-04-03 ENCOUNTER — Inpatient Hospital Stay: Payer: Medicare PPO

## 2016-04-03 DIAGNOSIS — I471 Supraventricular tachycardia: Secondary | ICD-10-CM

## 2016-04-03 LAB — COMPREHENSIVE METABOLIC PANEL
ALBUMIN: 3.2 g/dL — AB (ref 3.5–5.0)
ALK PHOS: 54 U/L (ref 38–126)
ALT: 16 U/L — ABNORMAL LOW (ref 17–63)
ANION GAP: 4 — AB (ref 5–15)
AST: 46 U/L — AB (ref 15–41)
BUN: 17 mg/dL (ref 6–20)
CALCIUM: 8.5 mg/dL — AB (ref 8.9–10.3)
CO2: 28 mmol/L (ref 22–32)
Chloride: 107 mmol/L (ref 101–111)
Creatinine, Ser: 0.59 mg/dL — ABNORMAL LOW (ref 0.61–1.24)
GFR calc Af Amer: >60 mL/min (ref >60)
GFR calc non Af Amer: >60 mL/min (ref >60)
GLUCOSE: 134 mg/dL — AB (ref 65–99)
Potassium: 3.7 mmol/L (ref 3.5–5.1)
SODIUM: 139 mmol/L (ref 135–145)
Total Bilirubin: 0.7 mg/dL (ref 0.3–1.2)
Total Protein: 5.7 g/dL — ABNORMAL LOW (ref 6.5–8.1)

## 2016-04-03 LAB — CBC
HEMATOCRIT: 38.5 % — AB (ref 40.0–52.0)
HEMOGLOBIN: 13.1 g/dL (ref 13.0–18.0)
MCH: 34.4 pg — ABNORMAL HIGH (ref 26.0–34.0)
MCHC: 33.9 g/dL (ref 32.0–36.0)
MCV: 101.5 fL — ABNORMAL HIGH (ref 80.0–100.0)
Platelets: 94 10*3/uL — ABNORMAL LOW (ref 150–440)
RBC: 3.8 MIL/uL — AB (ref 4.40–5.90)
RDW: 12.2 % (ref 11.5–14.5)
WBC: 6.7 10*3/uL (ref 3.8–10.6)

## 2016-04-03 LAB — PROCALCITONIN: PROCALCITONIN: 7.56 ng/mL

## 2016-04-03 MED ORDER — HYDRALAZINE HCL 20 MG/ML IJ SOLN
10.0000 mg | INTRAMUSCULAR | Status: DC | PRN
Start: 2016-04-03 — End: 2016-04-09
  Administered 2016-04-03 – 2016-04-07 (×5): 10 mg via INTRAVENOUS
  Filled 2016-04-03 (×6): qty 1

## 2016-04-03 MED ORDER — MEMANTINE HCL ER 14 MG PO CP24
28.0000 mg | ORAL_CAPSULE | Freq: Every day | ORAL | Status: DC
  Administered 2016-04-05 – 2016-04-08 (×4): 28 mg via ORAL
  Filled 2016-04-03 (×4): qty 2

## 2016-04-03 MED ORDER — PRIMIDONE 50 MG PO TABS
75.0000 mg | ORAL_TABLET | ORAL | Status: DC
  Administered 2016-04-05 – 2016-04-09 (×5): 75 mg via ORAL
  Filled 2016-04-03: qty 1.5
  Filled 2016-04-03 (×3): qty 2
  Filled 2016-04-03: qty 1.5
  Filled 2016-04-03 (×3): qty 2

## 2016-04-03 MED ORDER — VITAMIN B-12 1000 MCG PO TABS
1000.0000 ug | ORAL_TABLET | Freq: Every day | ORAL | Status: DC
  Administered 2016-04-05 – 2016-04-09 (×5): 1000 ug via ORAL
  Filled 2016-04-03 (×5): qty 1

## 2016-04-03 MED ORDER — METOPROLOL TARTRATE 5 MG/5ML IV SOLN
5.0000 mg | Freq: Every day | INTRAVENOUS | Status: DC
  Administered 2016-04-03 (×2): 5 mg via INTRAVENOUS
  Filled 2016-04-03 (×2): qty 5

## 2016-04-03 MED ORDER — MEMANTINE HCL ER 14 MG PO CP24: 28.0000 mg | ORAL_CAPSULE | Freq: Every day | ORAL | Status: DC

## 2016-04-03 MED ORDER — DONEPEZIL HCL 5 MG PO TABS: 10.0000 mg | ORAL_TABLET | Freq: Every day | ORAL | Status: DC

## 2016-04-03 MED ORDER — LACTATED RINGERS IV SOLN
INTRAVENOUS | Status: DC
  Administered 2016-04-03 – 2016-04-05 (×4): via INTRAVENOUS

## 2016-04-03 MED ORDER — DONEPEZIL HCL 5 MG PO TABS
10.0000 mg | ORAL_TABLET | Freq: Every day | ORAL | Status: DC
  Administered 2016-04-05 – 2016-04-08 (×4): 10 mg via ORAL
  Filled 2016-04-03 (×4): qty 2

## 2016-04-03 MED ORDER — METOPROLOL TARTRATE 5 MG/5ML IV SOLN: 5.0000 mg | Freq: Three times a day (TID) | INTRAVENOUS | Status: DC

## 2016-04-03 MED ORDER — ASPIRIN 81 MG PO CHEW
81.0000 mg | CHEWABLE_TABLET | Freq: Every day | ORAL | Status: DC
  Administered 2016-04-05 – 2016-04-09 (×5): 81 mg via ORAL
  Filled 2016-04-03 (×5): qty 1

## 2016-04-03 NOTE — Progress Notes (Signed)
Pt is alert to self, which is baseline per family. Pt continues to have constant tremors which is also baseline. Pt remains on cooling blanket with LR at 50. Pt is NPO per orders. Family at bedside. No complaints of pain on my shift. SCD's on. Pt in stable in condition at this time report given to oncoming shift.   Pt was extubated today and is on 2 L  sats at 100. HR remains irregular but stable.

## 2016-04-03 NOTE — Progress Notes (Signed)
PULMONARY / CRITICAL CARE MEDICINE   Name: Jonathan Bean MRN: [id number] DOB: 02-03-1926    ADMISSION DATE:  04/01/2016 CONSULTATION DATE:  04/02/16  REFERRING MD: Phillip Heal  CHIEF COMPLAINT: Respiratory distress, increased work of breathing  DISCUSSION: 82 year old male with a history of Alzheimer's disease, Parkinson's disease, Lewybody dementia presents with pneumonia now intubated for respiratory distress and on mechanical ventilation. Patient tolerating pressure support ventilation well and following simple commands. Will extubate today 04/03/16  PAST MEDICAL HISTORY :  He  has a past medical history of Alzheimer's dementia; Apraxia; Lewy body dementia; Parkinson's disease ([place]); and Hypertension.  PAST SURGICAL HISTORY: He  has no past surgical history on file.  Allergies  Allergen Reactions  . Penicillins Other (See Comments)    Daughter states deadly    No current facility-administered medications on file prior to encounter.   Current Outpatient Prescriptions on File Prior to Encounter  Medication Sig  . aspirin EC 81 MG tablet Take 81 mg by mouth daily.  . cholecalciferol (VITAMIN D) 1000 UNITS tablet Take 1,000 Units by mouth daily.  . fluticasone (FLONASE) 50 MCG/ACT nasal spray Place 1 spray into both nostrils daily.  . primidone (MYSOLINE) 50 MG tablet Take 75 mg by mouth every morning.   . vitamin B-12 (CYANOCOBALAMIN) 1000 MCG tablet Take 1,000 mcg by mouth daily.    FAMILY HISTORY:  His has no family status information on file.   SOCIAL HISTORY: He  reports that he quit smoking about 26 years ago. His smoking use included Pipe. He does not have any smokeless tobacco history on file.  REVIEW OF SYSTEMS:   Unable to obtain  SUBJECTIVE:  Unable to obtain  VITAL SIGNS: BP 148/94 mmHg  Pulse 61  Temp(Src) 99.3 F (37.4 C) (Core (Comment))  Resp 19  Ht 5\' 7"  (1.702 m)  Wt 73.029 kg (161 lb)  BMI 25.21 kg/m2  SpO2 100%  HEMODYNAMICS:     VENTILATOR SETTINGS: Vent Mode:  [-] PSV FiO2 (%):  [28 %] 28 % PEEP:  [5 cmH20] 5 cmH20 Pressure Support:  [8 cmH20-13 cmH20] 13 cmH20  INTAKE / OUTPUT: I/O last 3 completed shifts: In: 1391.5 [P.O.:30; I.V.:262.5; NG/GT:549; IV Piggyback:550] Out: [id number] [Urine:1225]  PHYSICAL EXAMINATION: General: Elderly, white male ,Follow simple commands Neuro:awake, follows simple commands. HEENT: Atraumatic, normocephalic, no discharge, PERRLA Cardiovascular:  S1-S2, regular rate and rhythm, no MRG noted Lungs: clear bilaterally, diminished bases, no wheezes, crackles, rhonchi noted. Abdomen: Soft, nontender, positive bowel sounds Musculoskeletal: no inflammation or deformity noted Skin:  grossly intact  LABS:  BMET  Recent Labs Lab 04/01/16 1112 04/02/16 0103 04/03/16 0327  NA 136 139 139  K 3.7 3.2* 3.7  CL 99* 101 107  CO2 29 25 28   BUN 18 16 17   CREATININE 0.68 0.77 0.59*  GLUCOSE 251* 193* 134*    Electrolytes  Recent Labs Lab 04/01/16 1112 04/02/16 0103 04/03/16 0327  CALCIUM 8.9 8.4* 8.5*  MG  --  1.6*  --     CBC  Recent Labs Lab 04/01/16 1112 04/02/16 0103 04/03/16 0327  WBC 9.9 6.7 6.7  HGB 14.5 14.5 13.1  HCT 42.0 42.6 38.5*  PLT 122* 108* 94*    Coag's No results for input(s): APTT, INR in the last 168 hours.  Sepsis Markers  Recent Labs Lab 04/01/16 1112 04/01/16 1527 04/02/16 1008 04/03/16 0327  LATICACIDVEN 2.1* 1.6  --   --   PROCALCITON  --   --  7.90 7.56  ABG  Recent Labs Lab 04/02/16 0047  PHART 7.36  PCO2ART 34  PO2ART 119*    Liver Enzymes  Recent Labs Lab 04/01/16 1112 04/03/16 0327  AST 24 46*  ALT 13* 16*  ALKPHOS 80 54  BILITOT 1.2 0.7  ALBUMIN 4.1 3.2*    Cardiac Enzymes  Recent Labs Lab 04/01/16 1112  TROPONINI <0.03    Glucose  Recent Labs Lab 04/02/16 0104 04/02/16 0729  GLUCAP 185* 172*    Imaging Dg Chest Port 1 View  04/03/2016  ADDENDUM REPORT: 04/03/2016 08:08  ADDENDUM: Endotracheal tube has been advanced.  Tip is 6 mm from the carina. Electronically Signed   By: Marybelle Killings M.D.   On: 04/03/2016 08:08  04/03/2016  CLINICAL DATA:  Respiratory failure EXAM: PORTABLE CHEST 1 VIEW COMPARISON:  Yesterday FINDINGS: Endotracheal and NG tubes are stable. Upper normal heart size for technique. Bibasilar atelectasis has increased. No definite consolidation. Prominent central hilar vascular prominence persists. No pneumothorax. IMPRESSION: Increasing bibasilar atelectasis. Electronically Signed: By: Marybelle Killings M.D. On: 04/03/2016 08:04     STUDIES:  None   CULTURES: 5/15. Urine culture NGTD 5/15 blood cultures>> 5/15 MRSA PCR> negative  ANTIBIOTICS: 5/15Levofloxacin> 5/15 5/15 vancomycin > 5/15  5/16 meropenem>> 5/15 Azactam>5/16 SIGNIFICANT EVENTS:   LINES/TUBES: 5/16 ET tube>>  DISCUSSION: 82 year old male with a history of Alzheimer's disease, Parkinson's disease, Lewybody dementia presents with pneumonia now intubated for respiratory distress and on mechanical ventilation.   ASSESSMENT / PLAN:  PULMO Acute hypoxemic respiratory failure-resolved, extubate 04/03/16 Pneumonia P:  Extubate 5/17 Support with oxygen to keep sats>92% DuoNeb when necessary  CARDIOVASCULAR A:   history of hypertension Narrow complex Supra Ventricular tachycardia  P:  Continuous telemetry monitor vital signs Metoprolol 6 times daily and prn  RENAL A:   No active issues P:   Replace electrolytes per ICU protocol GASTROINTESTINAL A:   No active issues P: Lactated ringer@50ml  Hold tube feeding , extubated 5/17 Will need swallow evaluation   Continue Pepcid   HEMATOLOGIC A:   No active issues P:  SCDs Lovenox for DVT prophylaxis   INFECTIOUS Pneumonia P:   Antibiotics as above Monitor fever curve  ENDOCRINE A:   No active issues P:   Blood sugar checks intermittently  NEUROLOGIC A:   Metabolic encephalopathy due to  infection Parkinson's disease lewy body dementia P:   RASS goal: 0 Minimize sedation medications per primary  Bincy Varughese,AG-ACNP Pulmonary and [facility]'s  Pager: [phone]  04/03/2016, 9:45 AM  PCCM ATTENDING ATTESTATION:  I have evaluated patient with ANP Patria Mane, reviewed database in its entirety and discussed care plan in detail. In addition, this patient was discussed on multidisciplinary rounds.   Important exam findings: RASS 0, + F/C Passed SBT and extubated - tolerating well PSVT  Major problems addressed by PCCM team: VDRF PSVT Dementia  PLAN/REC: Monitor in ICU post extubation Supplemental O2  Airway hygiene - encourage cough Change scheduled metoprolol to IV until taking POs Maintenance IVFs until taking POs  Daughter updated  CCM time: 35 mins The above time includes time spent in consultation with patient and/or family members and reviewing care plan on multidisciplinary rounds  Merton Border, MD PCCM service Mobile [phone] Pager [phone]

## 2016-04-03 NOTE — Progress Notes (Signed)
Pt extubated per Dr. Leonidas Romberg order. Pt able to speak to staff and cough. Pt tolerated extubation well. Pt HR elevated to 150's sustained, nonsymptomatic. Dr.Simmonds notified. Orders given to give 2.5mg  metoprolol.   Will continue to assess.

## 2016-04-03 NOTE — Progress Notes (Signed)
Pharmacy Antibiotic Note  Jonathan Bean is a 82 y.o. male admitted on 04/01/2016 with pneumonia/sepsis.  Pharmacy has been consulted for vancomycin and meropenem dosing. Vancomycin d/c 5/16 with negative MRSA PCR.   Plan: Will continue Meropenem 1 gram q 8 hours as ordered.  Height: 5\' 7"  (170.2 cm) Weight: 161 lb (73.029 kg) IBW/kg (Calculated) : 66.1  Temp (24hrs), Avg:98.8 F (37.1 C), Min:97.9 F (36.6 C), Max:100.2 F (37.9 C)   Recent Labs Lab 04/01/16 1112 04/01/16 1527 04/02/16 0103 04/03/16 0327  WBC 9.9  --  6.7 6.7  CREATININE 0.68  --  0.77 0.59*  LATICACIDVEN 2.1* 1.6  --   --     Estimated Creatinine Clearance: 60.8 mL/min (by C-G formula based on Cr of 0.59).    Allergies  Allergen Reactions  . Penicillins Other (See Comments)    Daughter states deadly    Antimicrobials this admission: Levaquin 5/15 x 1  Aztreonam 5/15 x 1 vancomycin  5/15 >> 5/16 meropenem 5/16 >>   Dose adjustments this admission:   Microbiology results: 5/16 TA: normal flora/pending 5/15 BCx: NGTD  5/15 UCx: NG 5/16 MRSA PCR: NG  5/15 UA: (-)    Thank you for allowing pharmacy to be a part of this patient's care.  Napoleon Form 04/03/2016 12:57 PM

## 2016-04-03 NOTE — Progress Notes (Signed)
Per orders from Dr. Alva Garnet the pt. Was extubated. He was suctioned prior to extubation for a small amount of white secretions. He was extubated without incident. No stridor was heard. He is voicing and coughing up secretions. He was placed on 2 L nasal cannula with a Sa02 of 99%.

## 2016-04-03 NOTE — Progress Notes (Signed)
Brief Nutrition noted:   Prior to assessment today, pt extubated and adult tube feeding protocol and dietitian consult discontinued by provider.  No full assessment needed at this time; will continue to assess need for assessment as per nutrition policy.   Kerman Passey [place], [place], LDN [phone] Pager  [phone] Weekend/On-Call Pager

## 2016-04-03 NOTE — Plan of Care (Signed)
Problem: Education: Goal: Knowledge of [facility] Education information/materials will improve Outcome: Completed/Met Date Met:  04/03/16 Pt family oriented to unit, had questions answered. Pt and family introduced to staff and had plan of care for today explained.

## 2016-04-03 NOTE — Progress Notes (Signed)
Chaplain rounded the unit and provided a compassionate presence and support for the family. Jonathan Bean [phone]

## 2016-04-03 NOTE — Care Management (Signed)
Patient is planned extubation today. Patient is from home followed by [facility] I have updated [facility] of patient current status. RNCM to continue to follow.

## 2016-04-03 NOTE — Plan of Care (Signed)
Problem: Safety: Goal: Ability to remain free from injury will improve Outcome: Progressing Pt remained free from injury on my shift. Bed alarm is on, bed in lowest position and family is at bedside.

## 2016-04-04 LAB — PROCALCITONIN: Procalcitonin: 4.21 ng/mL

## 2016-04-04 LAB — CULTURE, RESPIRATORY W GRAM STAIN

## 2016-04-04 LAB — CULTURE, RESPIRATORY: CULTURE: NORMAL

## 2016-04-04 LAB — GLUCOSE, CAPILLARY
Glucose-Capillary: 98 mg/dL (ref 65–99)
Glucose-Capillary: 116 mg/dL — ABNORMAL HIGH (ref 65–99)

## 2016-04-04 NOTE — Plan of Care (Signed)
Problem: Pain Managment: Goal: General experience of comfort will improve Outcome: Progressing Pt has remained pain free on my shift.

## 2016-04-04 NOTE — Evaluation (Signed)
Clinical/Bedside Swallow Evaluation Patient Details  Name: Jonathan Bean MRN: [id number] Date of Birth: 04/15/1926  Today's Date: 04/04/2016 Time: SLP Start Time (ACUTE ONLY): 1015 SLP Stop Time (ACUTE ONLY): 1115 SLP Time Calculation (min) (ACUTE ONLY): 60 min  Past Medical History:  Past Medical History  Diagnosis Date  . Alzheimer's dementia   . Apraxia   . Lewy body dementia   . Parkinson's disease ([place])   . Hypertension    Past Surgical History: History reviewed. No pertinent past surgical history. HPI:  Pt is an 82 year old male with a history of Alzheimer's disease, Parkinson's disease, Lewybody dementia presents with pneumonia intubated for respiratory distress extubated on 04/03/16. He resides at home w/ total care assistance.    Assessment / Plan / Recommendation Clinical Impression  Pt presents w/ increased risk for aspiration d/t oropharyngeal phase dysphagia noted and greatly impacted by declined Cognitive status. MD consulted.     Aspiration Risk  Moderate aspiration risk;Severe aspiration risk    Diet Recommendation  NPO w/ trials of puree - applesauce; 100% supervision w/ NSG; strict aspiration precautions  Medication Administration: Crushed with puree    Other  Recommendations Recommended Consults:  (Dietician) Oral Care Recommendations: Oral care QID;Staff/trained caregiver to provide oral care Other Recommendations:  (TBD)   Follow up Recommendations   (TBD)    Frequency and Duration min 3x week  2 weeks       Prognosis Prognosis for Safe Diet Advancement: Guarded Barriers to Reach Goals: Cognitive deficits;Severity of deficits      Swallow Study   General Date of Onset: 04/01/16 HPI: Pt is an 82 year old male with a history of Alzheimer's disease, Parkinson's disease, Lewybody dementia presents with pneumonia intubated for respiratory distress extubated on 04/03/16. He resides at home w/ total care assistance.  Type of Study: Bedside Swallow  Evaluation Previous Swallow Assessment: none reported Diet Prior to this Study: Thin liquids ("soft foods") Temperature Spikes Noted: Yes (wbc 6.7;  temp. 99.2) Respiratory Status: Nasal cannula (2 liters) History of Recent Intubation: Yes Length of Intubations (days): 2 days Date extubated: 04/03/16 Behavior/Cognition: Cooperative;Pleasant mood;Confused;Distractible;Lethargic/Drowsy;Requires cueing Oral Cavity Assessment: Within Functional Limits Oral Care Completed by SLP: Recent completion by staff Oral Cavity - Dentition: Adequate natural dentition;Missing dentition Vision:  (n/a) Self-Feeding Abilities: Total assist Patient Positioning: Postural control adequate for testing;Upright in bed Baseline Vocal Quality: Low vocal intensity Volitional Cough: Cognitively unable to elicit (congested cough involuntarily) Volitional Swallow: Unable to elicit    Oral/Motor/Sensory Function Overall Oral Motor/Sensory Function:  (appeared wfl w/ bolus management)   Ice Chips Ice chips: Impaired Presentation: Spoon (fed, 5 trials) Oral Phase Impairments: Reduced lingual movement/coordination;Poor awareness of bolus Oral Phase Functional Implications: Prolonged oral transit Pharyngeal Phase Impairments: Suspected delayed Swallow;Multiple swallows;Cough - Delayed;Throat Clearing - Delayed;Decreased hyoid-laryngeal movement (audible swallows)   Thin Liquid Thin Liquid: Not tested    Nectar Thick Nectar Thick Liquid: Not tested   Honey Thick Honey Thick Liquid: Not tested   Puree Puree: Impaired Presentation: Spoon (fed; 10 trials) Oral Phase Impairments: Reduced lingual movement/coordination;Poor awareness of bolus Oral Phase Functional Implications: Prolonged oral transit Pharyngeal Phase Impairments: Suspected delayed Swallow;Multiple swallows;Decreased hyoid-laryngeal movement;Throat Clearing - Delayed (x1; audible swallows)   Solid   GO   Solid: Not tested       Orinda Kenner, MS,  CCC-SLP  [name],[name] 04/04/2016,5:09 PM

## 2016-04-04 NOTE — Progress Notes (Signed)
Pt is currently still drowsy. Dr.Simmonds aware of PO medications that are scheduled including Primidone. Dr.Simmonds says to hold all PO meds at this time.   Will continue to assess.

## 2016-04-04 NOTE — Progress Notes (Signed)
Pharmacy Antibiotic Note  Jonathan Bean is a 82 y.o. male admitted on 04/01/2016 with pneumonia/sepsis.  Pharmacy has been consulted for vancomycin and meropenem dosing. Vancomycin d/c 5/16 with negative MRSA PCR.   Plan: Will continue Meropenem 1 gram q 8 hours as ordered.  Height: 5\' 7"  (170.2 cm) Weight: 161 lb (73.029 kg) IBW/kg (Calculated) : 66.1  Temp (24hrs), Avg:98.9 F (37.2 C), Min:98.2 F (36.8 C), Max:100.1 F (37.8 C)   Recent Labs Lab 04/01/16 1112 04/01/16 1527 04/02/16 0103 04/03/16 0327  WBC 9.9  --  6.7 6.7  CREATININE 0.68  --  0.77 0.59*  LATICACIDVEN 2.1* 1.6  --   --     Estimated Creatinine Clearance: 60.8 mL/min (by C-G formula based on Cr of 0.59).    Allergies  Allergen Reactions  . Penicillins Other (See Comments)    Daughter states deadly    Antimicrobials this admission: Levaquin 5/15 x 1  Aztreonam 5/15 x 1 vancomycin  5/15 >> 5/16 meropenem 5/16 >>   Dose adjustments this admission:   Microbiology results: 5/16 TA: normal flora/pending 5/15 BCx: NGTD  5/15 UCx: NG 5/16 MRSA PCR: NG  5/15 UA: (-)    Thank you for allowing pharmacy to be a part of this patient's care.  Tahjir Silveria D 04/04/2016 9:45 AM

## 2016-04-04 NOTE — Progress Notes (Signed)
PULMONARY / CRITICAL CARE MEDICINE   Name: Jonathan Bean MRN: [id number] DOB: October 25, 1926    ADMISSION DATE:  04/01/2016 CONSULTATION DATE:  04/02/16  REFERRING MD: Phillip Heal  CHIEF COMPLAINT: Respiratory distress, increased work of breathing  DISCUSSION: 82 year old male with a history of Alzheimer's disease, Parkinson's disease, Lewybody dementia presents with pneumonia  intubated for respiratory distress extubated on 04/03/16.  PAST MEDICAL HISTORY :  He  has a past medical history of Alzheimer's dementia; Apraxia; Lewy body dementia; Parkinson's disease ([place]); and Hypertension.  PAST SURGICAL HISTORY: He  has no past surgical history on file.  Allergies  Allergen Reactions  . Penicillins Other (See Comments)    Daughter states deadly    No current facility-administered medications on file prior to encounter.   Current Outpatient Prescriptions on File Prior to Encounter  Medication Sig  . aspirin EC 81 MG tablet Take 81 mg by mouth daily.  . cholecalciferol (VITAMIN D) 1000 UNITS tablet Take 1,000 Units by mouth daily.  . fluticasone (FLONASE) 50 MCG/ACT nasal spray Place 1 spray into both nostrils daily.  . primidone (MYSOLINE) 50 MG tablet Take 75 mg by mouth every morning.   . vitamin B-12 (CYANOCOBALAMIN) 1000 MCG tablet Take 1,000 mcg by mouth daily.    FAMILY HISTORY:  His has no family status information on file.   SOCIAL HISTORY: He  reports that he quit smoking about 26 years ago. His smoking use included Pipe. He does not have any smokeless tobacco history on file.  REVIEW OF SYSTEMS:   Unable to obtain  SUBJECTIVE:  Unable to obtain  VITAL SIGNS: BP 153/84 mmHg  Pulse 70  Temp(Src) 98.2 F (36.8 C) (Core (Comment))  Resp 16  Ht 5\' 7"  (1.702 m)  Wt 73.029 kg (161 lb)  BMI 25.21 kg/m2  SpO2 98%  HEMODYNAMICS:    VENTILATOR SETTINGS:    INTAKE / OUTPUT: I/O last 3 completed shifts: In: 2096.7 [P.O.:30; I.V.:1036.7; NG/GT:430; IV  Piggyback:600] Out: 2125 [Urine:2125]  PHYSICAL EXAMINATION: General: Elderly, white male ,Follow simple commands Neuro:awake, follows simple commands. HEENT: Atraumatic, normocephalic, no discharge, PERRLA Cardiovascular:  S1-S2, regular rate and rhythm, no MRG noted Lungs: clear bilaterally, diminished bases, no wheezes, crackles, rhonchi noted. Abdomen: Soft, nontender, positive bowel sounds Musculoskeletal: no inflammation or deformity noted Skin:  grossly intact  LABS:  BMET  Recent Labs Lab 04/01/16 1112 04/02/16 0103 04/03/16 0327  NA 136 139 139  K 3.7 3.2* 3.7  CL 99* 101 107  CO2 29 25 28   BUN 18 16 17   CREATININE 0.68 0.77 0.59*  GLUCOSE 251* 193* 134*    Electrolytes  Recent Labs Lab 04/01/16 1112 04/02/16 0103 04/03/16 0327  CALCIUM 8.9 8.4* 8.5*  MG  --  1.6*  --     CBC  Recent Labs Lab 04/01/16 1112 04/02/16 0103 04/03/16 0327  WBC 9.9 6.7 6.7  HGB 14.5 14.5 13.1  HCT 42.0 42.6 38.5*  PLT 122* 108* 94*    Coag's No results for input(s): APTT, INR in the last 168 hours.  Sepsis Markers  Recent Labs Lab 04/01/16 1112 04/01/16 1527 04/02/16 1008 04/03/16 0327 04/04/16 0328  LATICACIDVEN 2.1* 1.6  --   --   --   PROCALCITON  --   --  7.90 7.56 4.21    ABG  Recent Labs Lab 04/02/16 0047  PHART 7.36  PCO2ART 34  PO2ART 119*    Liver Enzymes  Recent Labs Lab 04/01/16 1112 04/03/16 0327  AST  24 46*  ALT 13* 16*  ALKPHOS 80 54  BILITOT 1.2 0.7  ALBUMIN 4.1 3.2*    Cardiac Enzymes  Recent Labs Lab 04/01/16 1112  TROPONINI <0.03    Glucose  Recent Labs Lab 04/02/16 0104 04/02/16 0729 04/04/16 0735  GLUCAP 185* 172* 98    Imaging No results found.   STUDIES:  None   CULTURES: 5/15. Urine culture NGTD 5/15 blood cultures>> 5/15 MRSA PCR> negative  ANTIBIOTICS: 5/15Levofloxacin> 5/15 5/15 vancomycin > 5/15  5/16 meropenem>>5/17 5/15 Azactam>5/16 SIGNIFICANT  EVENTS:   LINES/TUBES: 5/16 ET tube>>  DISCUSSION: 82 year old male with a history of Alzheimer's disease, Parkinson's disease, Lewybody dementia presents with pneumonia now intubated for respiratory distress and on mechanical ventilation.   ASSESSMENT / PLAN:  PULMO Acute hypoxemic respiratory failure-resolved, extubate 04/03/16 Pneumonia P:  Extubate 5/17 Support with oxygen to keep sats>92% DuoNeb when necessary  CARDIOVASCULAR A:   history of hypertension Narrow complex Supra Ventricular tachycardia  P:  Continuous telemetry monitor vital signs Metoprolol 6 times daily and prn  RENAL A:   No active issues P:   Replace electrolytes per ICU protocol GASTROINTESTINAL A:   No active issues P: Lactated ringer@50ml  Will need swallow evaluation   Continue Pepcid   HEMATOLOGIC A:   No active issues P:  SCDs Lovenox for DVT prophylaxis   INFECTIOUS Pneumonia P:   augmentin p/o if able to swallow Monitor fever curve  ENDOCRINE A:   No active issues P:   Blood sugar checks intermittently  NEUROLOGIC A:   Metabolic encephalopathy due to infection Parkinson's disease lewy body dementia Physical deconditioning P:   RASS goal: 0 Minimize sedation Continue memantidine and Aricept medications per primary PT consulted   Bincy Varughese,AG-ACNP Pulmonary and Critical Care Medicine [facility]   04/04/2016, 11:15 AM   PCCM ATTENDING ATTESTATION:  I have evaluated patient with ANP T[place], reviewed database in its entirety and discussed care plan in detail. In addition, this patient was discussed on multidisciplinary rounds.    Merton Border, MD PCCM service Mobile [phone] Pager [phone]

## 2016-04-04 NOTE — Progress Notes (Signed)
Pt was arouses to voice today. Pt remains only oriented to self. Pt remains NPO at this time and cognition and ability to swallow are in reasonable questionable. Pt foley was removed today and replaced with a condom cath. Pt remains on LR at 50 ml/ hour.  No signs of pain when using the Advanced dementia pain scale. Pt denies pain as well. Pt Hr has maintained in the low 60's to mid 70's today, metoprolol held. Pt has been hypertensive on  My shift MD aware , no new orders given. Pt in stable condition at this time.  Report given to oncoming nurse.

## 2016-04-04 NOTE — Progress Notes (Signed)
New orders given to remove catheter and put on a condom cath. PT now transferred to stepdown status. Per Cherre Robins, NP. Will continue to assess.

## 2016-04-05 DIAGNOSIS — F0390 Unspecified dementia without behavioral disturbance: Secondary | ICD-10-CM

## 2016-04-05 DIAGNOSIS — J189 Pneumonia, unspecified organism: Principal | ICD-10-CM

## 2016-04-05 MED ORDER — CHLORHEXIDINE GLUCONATE 0.12 % MT SOLN
15.0000 mL | Freq: Two times a day (BID) | OROMUCOSAL | Status: DC
  Administered 2016-04-05 – 2016-04-08 (×8): 15 mL via OROMUCOSAL
  Filled 2016-04-05 (×6): qty 15

## 2016-04-05 MED ORDER — LEVOFLOXACIN 500 MG PO TABS
500.0000 mg | ORAL_TABLET | Freq: Every day | ORAL | Status: AC
  Administered 2016-04-05 – 2016-04-07 (×3): 500 mg via ORAL
  Filled 2016-04-05 (×4): qty 1

## 2016-04-05 MED ORDER — CETYLPYRIDINIUM CHLORIDE 0.05 % MT LIQD
7.0000 mL | Freq: Two times a day (BID) | OROMUCOSAL | Status: DC
  Administered 2016-04-05 – 2016-04-09 (×6): 7 mL via OROMUCOSAL

## 2016-04-05 MED ORDER — HALOPERIDOL 1 MG PO TABS
1.0000 mg | ORAL_TABLET | ORAL | Status: DC | PRN
  Administered 2016-04-05: 1 mg via ORAL
  Filled 2016-04-05 (×2): qty 1

## 2016-04-05 MED ORDER — CETYLPYRIDINIUM CHLORIDE 0.05 % MT LIQD: 7.0000 mL | Freq: Two times a day (BID) | OROMUCOSAL | Status: DC

## 2016-04-05 MED ORDER — LORAZEPAM 1 MG PO TABS
1.0000 mg | ORAL_TABLET | ORAL | Status: DC | PRN
  Administered 2016-04-07: 1 mg via ORAL
  Filled 2016-04-05: qty 1

## 2016-04-05 MED ORDER — METOPROLOL TARTRATE 25 MG PO TABS
25.0000 mg | ORAL_TABLET | Freq: Two times a day (BID) | ORAL | Status: DC
  Administered 2016-04-05 – 2016-04-09 (×9): 25 mg via ORAL
  Filled 2016-04-05 (×9): qty 1

## 2016-04-05 MED ORDER — ACETAMINOPHEN 325 MG PO TABS
650.0000 mg | ORAL_TABLET | ORAL | Status: DC | PRN
  Administered 2016-04-05 – 2016-04-08 (×7): 650 mg via ORAL
  Filled 2016-04-05 (×7): qty 2

## 2016-04-05 NOTE — Progress Notes (Addendum)
Patient's heart rhythm per monitor has gone back and forth between NSR and a-fib this morning. HR is controlled and patient is resting comfortably. Per monitor pt had similar episodes of a-fib yesterday as well. Dr. Alva Garnet aware, pt is to be transferred to 2A on telemetry, no other orders received. Nurse has been given report, family is aware/agreeable to transfer. All belongings to be transferred with patient.

## 2016-04-05 NOTE — Progress Notes (Signed)
SUMMARY: 12 M admitted 05/15 by hospitalist service with PMH of Lewy body dementia, Parkinsonism, hypertension and HPI of fever, cough, somnolence. Admission CXR revealed infiltrate c/w PNA. He developed progressive somnolence and was not protecting his airway. Intubated in early morning hours 05/16. Treated initially with vanc, aztreonam and levofloxacin then switched to meropenem on 05/17. He was extubated 05/17 and has tolerated. He remains somewhat somnolent (which his daughter attributes to him not getting meds for dementia) but awakens to voice and follows commands. Transferred back to med-surg with cardiac monitoring 05/19. Other problems include PSVT X several occurences. He has no definite prior diagnosis of this but his daughter notes that he has had irregular heart beat in the past. Metoprolol was initiated for this. Just prior to transfer he had bouts of atrial fibrillation with controlled rate   SUBJ: Somnolent but responds to voice. No distress. + F/C   OBJ: Filed Vitals:   04/05/16 1329 04/05/16 1344 04/05/16 1400 04/05/16 1437  BP: 165/72 149/73 155/93 157/68  Pulse: 71 73 82 72  Temp: 99 F (37.2 C) 99 F (37.2 C) 98.6 F (37 C)   TempSrc:      Resp: 18 20 22 16   Height:      Weight:      SpO2: 97% 97% 97% 97%   NAD RASS -1, + F/C HEENT WNL No JVD Chest clear anteriorly Reg, no M NABS, soft Ext warm without edema No focal neurological deficits  BMP Latest Ref Rng 04/03/2016 04/02/2016 04/01/2016  Glucose 65 - 99 mg/dL 134(H) 193(H) 251(H)  BUN 6 - 20 mg/dL 17 16 18   Creatinine 0.61 - 1.24 mg/dL 0.59(L) 0.77 0.68  Sodium 135 - 145 mmol/L 139 139 136  Potassium 3.5 - 5.1 mmol/L 3.7 3.2(L) 3.7  Chloride 101 - 111 mmol/L 107 101 99(L)  CO2 22 - 32 mmol/L 28 25 29   Calcium 8.9 - 10.3 mg/dL 8.5(L) 8.4(L) 8.9    CBC Latest Ref Rng 04/03/2016 04/02/2016 04/01/2016  WBC 3.8 - 10.6 K/uL 6.7 6.7 9.9  Hemoglobin 13.0 - 18.0 g/dL 13.1 14.5 14.5  Hematocrit 40.0 - 52.0 %  38.5(L) 42.6 42.0  Platelets 150 - 440 K/uL 94(L) 108(L) 122(L)    CXR: No new film  IMPRESSION: 1) baseline dementia - highly functioning 2) CAP, NOS - PCN allergy noted 3) acute respiratory failure - resolved 4) PSVT, PAF 5) Persistent somnolence 6) Dysphagia due to decreased LOC - taking meds crushed in apple sauce  PLAN/REC: Transfer to med-surg. Hospitalists to resume primary duties in AM 05/20 and PCCM will sign off  Discussed with Dr Darvin Neighbours Resume Aricept and Namenda when able Change abx to levofloxacin and complete 3 more days Advance diet and activity as able Continue maintenance IVFs until meeting requirements by mouth   Merton Border, MD PCCM service Mobile [phone] Pager [phone] 04/05/2016

## 2016-04-05 NOTE — Progress Notes (Signed)
Pt is agitated. MD notified. Orders for haldol and ativan received. I will continue to assess.

## 2016-04-05 NOTE — Progress Notes (Addendum)
Patient complaining of lower abdominal pain this afternoon, that is tender to palpation. Bladder can performed and 230mls were seen. Patient has condom catheter in place and has voided 257mls of urine so far today. Dr. Alva Garnet aware.

## 2016-04-05 NOTE — Progress Notes (Signed)
Speech Language Pathology Dysphagia Treatment Patient Details Name: Jonathan Bean MRN: [id number] DOB: [birth year] Today's Date: 04/05/2016 Time: [id number] SLP Time Calculation (min) (ACUTE ONLY): 34 min  Assessment / Plan / Recommendation Clinical Impression   pt continues to present with oral pharyngeal dysphagia characterized by lengthy mastication with puree trials. Pt presented with wet vocal quality with whole teaspoon of puree and honey thick liquids. Pt with no overt ssx aspiration with half teaspoon of puree trials. Pt required cuing to cough post intake with wet vocal quality noted. Pt was noted to have increased wheezing during intake. No diet recommended at this time due to moderate to severe  Risk of aspiration. Family educated on pt current recommendations and verbally agreed to NPO with puree for pleasure.     Diet Recommendation    NPO with puree for pleasure, no liquids.   SLP Plan Continue with current plan of care   Pertinent Vitals/Pain No pain reported.   Swallowing Goals     General Behavior/Cognition: Alert;Cooperative;Pleasant mood;Confused Patient Positioning: Upright in bed Oral care provided: Yes HPI: Pt is an 82 year old male with a history of Alzheimer's disease, Parkinson's disease, Lewybody dementia presents with pneumonia intubated for respiratory distress extubated on 04/03/16. He resides at home w/ total care assistance.   Oral Cavity - Oral Hygiene     Dysphagia Treatment Family/Caregiver Educated: Daughter and care giver Treatment Methods: Skilled observation;Upgraded PO texture trial;Compensation strategy training Patient observed directly with PO's: Yes Type of PO's observed: Dysphagia 1 (puree);Honey-thick liquids Feeding: Total assist Liquids provided via: Teaspoon Oral Phase Signs & Symptoms: Prolonged mastication Pharyngeal Phase Signs & Symptoms: Audible swallow;Wet vocal quality;Delayed throat clear Type of cueing:  Verbal;Visual Amount of cueing: Minimal   GO     [place] Sauber 04/05/2016, 4:14 PM

## 2016-04-05 NOTE — Progress Notes (Signed)
Pt became more alert as the morning went on. Was able to complete an applesauce eating trial and patient successfully swallowed a few spoonfuls of applesauce. Pt was also able to successfully swallow his PO medications crushed in applesauce. Speech therapy has been updated. They plan to attempt to reevaluate patient sometime this afternoon.

## 2016-04-06 LAB — CULTURE, BLOOD (ROUTINE X 2)
CULTURE: NO GROWTH
Culture: NO GROWTH

## 2016-04-06 NOTE — Progress Notes (Signed)
[place] [facility] [place] NAME: Jonathan Bean    MR#:  [id number]  DATE OF BIRTH:  [birth year]  SUBJECTIVE:  CHIEF COMPLAINT:   Chief Complaint  Patient presents with  . Fever  . Weakness  . Nasal Congestion  slowly improving. Daughter at bedside - Worried about blood clot in legs, no symptoms other than mild back pain (known L4-5 dz)  REVIEW OF SYSTEMS:  Review of Systems  Constitutional: Positive for malaise/fatigue. Negative for fever, weight loss and diaphoresis.  HENT: Negative for ear discharge, ear pain, hearing loss, nosebleeds, sore throat and tinnitus.   Eyes: Negative for blurred vision and pain.  Respiratory: Negative for cough, hemoptysis, shortness of breath and wheezing.   Cardiovascular: Negative for chest pain, palpitations, orthopnea and leg swelling.  Gastrointestinal: Negative for heartburn, nausea, vomiting, abdominal pain, diarrhea, constipation and blood in stool.  Genitourinary: Negative for dysuria, urgency and frequency.  Musculoskeletal: Negative for myalgias and back pain.  Skin: Negative for itching and rash.  Neurological: Positive for weakness. Negative for dizziness, tingling, tremors, focal weakness, seizures and headaches.  Psychiatric/Behavioral: Negative for depression. The patient is not nervous/anxious.     DRUG ALLERGIES:   Allergies  Allergen Reactions  . Penicillins Other (See Comments)    Daughter states deadly   VITALS:  Blood pressure 134/73, pulse 79, temperature 97.5 F (36.4 C), temperature source Oral, resp. rate 18, height 5\' 7"  (1.702 m), weight 73.029 kg (161 lb), SpO2 93 %. PHYSICAL EXAMINATION:  Physical Exam  Constitutional: He is oriented to person, place, and time and well-developed, well-nourished, and in no distress.  HENT:  Head: Normocephalic and atraumatic.  Eyes: Conjunctivae and EOM are normal. Pupils are equal, round, and reactive to light.  Neck: Normal range  of motion. Neck supple. No tracheal deviation present. No thyromegaly present.  Cardiovascular: Normal rate, regular rhythm and normal heart sounds.   Pulmonary/Chest: Effort normal and breath sounds normal. No respiratory distress. He has no wheezes. He exhibits no tenderness.  Abdominal: Soft. Bowel sounds are normal. He exhibits no distension. There is no tenderness.  Musculoskeletal: Normal range of motion.  Neurological: He is alert and oriented to person, place, and time. No cranial nerve deficit.  Skin: Skin is warm and dry. No rash noted.  Psychiatric: Mood and affect normal.   LABORATORY PANEL:   CBC  Recent Labs Lab 04/03/16 0327  WBC 6.7  HGB 13.1  HCT 38.5*  PLT 94*   ------------------------------------------------------------------------------------------------------------------ Chemistries   Recent Labs Lab 04/02/16 0103 04/03/16 0327  NA 139 139  K 3.2* 3.7  CL 101 107  CO2 25 28  GLUCOSE 193* 134*  BUN 16 17  CREATININE 0.77 0.59*  CALCIUM 8.4* 8.5*  MG 1.6*  --   AST  --  46*  ALT  --  16*  ALKPHOS  --  54  BILITOT  --  0.7   RADIOLOGY:  No results found. ASSESSMENT AND PLAN:  * Acute respiratory failure: - Present on admission due to pneumonia - Now resolved  * Community acquired pneumonia - Treated with antibiotics - 2 more days of oral Levaquin  * Metabolic encephalopathy - Likely due to pneumonia - Now close to baseline mental status  * Parkinson's disease with Alzheimer's dementia - Highly functional - Follows with Dr. Manuella Ghazi at neurology outpatient - Continue Namenda, Aricept and primidone  * PSVT, PAF - Rate is controlled with metoprolol - Monitor on telemetry  *  Dysphagia due to decreased LOC - taking meds crushed in apple sauce   Physical therapy recommends short-term rehabilitation, although patient and family would prefer going home as he is already followed by home health services by [facility]  All the records are  reviewed and case discussed with Care Management/Social Worker. Management plans discussed with the patient, family and they are in agreement.  CODE STATUS: Full code  TOTAL TIME TAKING CARE OF THIS PATIENT: 35 minutes.   More than 50% of the time was spent in counseling/coordination of care: YES  POSSIBLE D/C IN 1-2 DAYS, DEPENDING ON CLINICAL CONDITION.   [facility], Dereka Lueras M.D on 04/06/2016 at 1:25 PM  Between 7am to 6pm - Pager - [phone]  After 6pm go to [email or web address] - password [email or web address] [facility]  Office  [phone]  CC: Primary care physician; BABAOFF, Caryl Bis, MD  Note: This dictation was prepared with Dragon dictation along with smaller phrase technology. Any transcriptional errors that result from this process are unintentional.

## 2016-04-06 NOTE — Evaluation (Signed)
Physical Therapy Evaluation Patient Details Name: Jonathan Bean MRN: [id number] DOB: Apr 26, 1926 Today's Date: 04/06/2016   History of Present Illness  82 yo male with onset of PNA with PMHx:  lewy body dementia, Parkinson's, apraxia,   Clinical Impression  Pt is giving PT limited effort due to weakness, but is not manageable for one person (caregiver) to take home yet.  Will continue with PT here for standing and transfers, and will ask for SNF short stay to increase his independence and safety.    Follow Up Recommendations SNF    Equipment Recommendations  None recommended by PT    Recommendations for Other Services Rehab consult     Precautions / Restrictions Precautions Precautions: Fall (telemetry) Restrictions Weight Bearing Restrictions: No      Mobility  Bed Mobility Overal bed mobility: Needs Assistance;+2 for physical assistance;+ 2 for safety/equipment Bed Mobility: Supine to Sit;Sit to Supine     Supine to sit: Mod assist;+2 for physical assistance;+2 for safety/equipment;HOB elevated Sit to supine: Mod assist;+2 for physical assistance;+2 for safety/equipment;HOB elevated      Transfers                 General transfer comment: pt is unable to stand, weak for sitting alone  Ambulation/Gait             General Gait Details: not able to stand  Stairs            Wheelchair Mobility    Modified Rankin (Stroke Patients Only)       Balance Overall balance assessment: Needs assistance Sitting-balance support: Bilateral upper extremity supported;Feet supported Sitting balance-Leahy Scale: Poor                                       Pertinent Vitals/Pain Pain Assessment: No/denies pain    Home Living Family/patient expects to be discharged to:: Private residence Living Arrangements: Children Available Help at Discharge: Family;Available 24 hours/day Type of Home: House       Home Layout: One level        Prior Function Level of Independence: Needs assistance      ADL's / Homemaking Assistance Needed: daughter is caring for him        Hand Dominance        Extremity/Trunk Assessment   Upper Extremity Assessment: Generalized weakness           Lower Extremity Assessment: Generalized weakness      Cervical / Trunk Assessment: Normal  Communication   Communication: No difficulties  Cognition Arousal/Alertness: Lethargic;Awake/alert Behavior During Therapy: Flat affect Overall Cognitive Status: History of cognitive impairments - at baseline       Memory: Decreased recall of precautions;Decreased short-term memory              General Comments General comments (skin integrity, edema, etc.): Pt is home with daughter and     Exercises        Assessment/Plan    PT Assessment Patient needs continued PT services  PT Diagnosis Generalized weakness   PT Problem List Decreased strength;Decreased range of motion;Decreased activity tolerance;Decreased balance;Decreased mobility;Decreased coordination;Decreased cognition;Decreased knowledge of use of DME;Decreased safety awareness;Decreased knowledge of precautions;Cardiopulmonary status limiting activity  PT Treatment Interventions DME instruction;Gait training;Functional mobility training;Therapeutic activities;Therapeutic exercise;Balance training;Neuromuscular re-education;Patient/family education   PT Goals (Current goals can be found in the Care Plan section) Acute Rehab PT Goals Patient Stated  Goal: none stated x BR PT Goal Formulation: With family Time For Goal Achievement: 04/20/16 Potential to Achieve Goals: Good    Frequency Min 2X/week   Barriers to discharge Decreased caregiver support (daughter is there alone with pt) needs 2 person assist to walk    Co-evaluation               End of Session Equipment Utilized During Treatment: Oxygen Activity Tolerance: Patient limited by fatigue;Patient  limited by lethargy Patient left: in bed;with call bell/phone within reach;with family/visitor present Nurse Communication: Mobility status         Time: [id number] PT Time Calculation (min) (ACUTE ONLY): 31 min   Charges:   PT Evaluation $PT Eval Low Complexity: 1 Procedure PT Treatments $Therapeutic Activity: 8-22 mins   PT G Codes:        Ramond Dial 2016/04/07, 11:55 AM    Mee Hives, PT MS Acute Rehab Dept. Number: [place] and [place]

## 2016-04-06 NOTE — Progress Notes (Signed)
Patient confused. Bedbound. Incontinent. Daughter at side. No complaints from patient. No s/s distress. 2L O2.  IV fluids infusing.

## 2016-04-06 NOTE — Progress Notes (Signed)
Speech Language Pathology Dysphagia Treatment Patient Details Name: SHERIDAN RITCHISON MRN: [id number] DOB: 1925-12-27 Today's Date: 04/06/2016 Time: [id number] SLP Time Calculation (min) (ACUTE ONLY): 31 min  Assessment / Plan / Recommendation Clinical Impression   pt continues to present with a moderate to severe oral pharyngeal dysphagia characterized by increased oral transit and decreased bolus control. Pt with some coughing prior to intake however no coughing or throat clear noted during intake of puree with pudding thick trials. slp discussed swallow strategies with puree and pudding thick liquids and using half teaspoon bites with very slow rate of feeding. Daughter able to give verbal agreement and understanding and return verbalize strategies. St contacted md and nursing to educate and request approval for puree with pudding thick liquid diet. nursing and md agreed.     Diet Recommendation    Dys 1 with pudding thick liquids   SLP Plan Continue with current plan of care   Pertinent Vitals/Pain No pain reported   Swallowing Goals     General Behavior/Cognition: Alert;Cooperative;Confused Patient Positioning: Upright in bed HPI: Pt is an 82 year old male with a history of Alzheimer's disease, Parkinson's disease, Lewybody dementia presents with pneumonia intubated for respiratory distress extubated on 04/03/16. He resides at home w/ total care assistance.   Oral Cavity - Oral Hygiene     Dysphagia Treatment Family/Caregiver Educated: Daughter Treatment Methods: Skilled observation;Upgraded PO texture trial;Compensation strategy training Patient observed directly with PO's: Yes Type of PO's observed: Dysphagia 1 (puree);Pudding-thick liquids Feeding: Total assist Liquids provided via: Teaspoon Oral Phase Signs & Symptoms: Prolonged mastication Pharyngeal Phase Signs & Symptoms: Audible swallow;Wet vocal quality;Delayed throat clear Type of cueing: Verbal;Visual Amount of  cueing: Minimal   GO     [place] Sauber 04/06/2016, 11:09 AM

## 2016-04-07 LAB — CBC
HCT: 39 % — ABNORMAL LOW (ref 40.0–52.0)
HEMOGLOBIN: 13.6 g/dL (ref 13.0–18.0)
MCH: 35 pg — AB (ref 26.0–34.0)
MCHC: 34.8 g/dL (ref 32.0–36.0)
MCV: 100.5 fL — ABNORMAL HIGH (ref 80.0–100.0)
PLATELETS: 175 10*3/uL (ref 150–440)
RBC: 3.88 MIL/uL — AB (ref 4.40–5.90)
RDW: 11.9 % (ref 11.5–14.5)
WBC: 6.1 10*3/uL (ref 3.8–10.6)

## 2016-04-07 LAB — MAGNESIUM: MAGNESIUM: 1.9 mg/dL (ref 1.7–2.4)

## 2016-04-07 LAB — BASIC METABOLIC PANEL
ANION GAP: 7 (ref 5–15)
BUN: 20 mg/dL (ref 6–20)
CHLORIDE: 102 mmol/L (ref 101–111)
CO2: 32 mmol/L (ref 22–32)
Calcium: 8.8 mg/dL — ABNORMAL LOW (ref 8.9–10.3)
Creatinine, Ser: 0.48 mg/dL — ABNORMAL LOW (ref 0.61–1.24)
Glucose, Bld: 126 mg/dL — ABNORMAL HIGH (ref 65–99)
POTASSIUM: 2.9 mmol/L — AB (ref 3.5–5.1)
SODIUM: 141 mmol/L (ref 135–145)

## 2016-04-07 MED ORDER — POTASSIUM CHLORIDE CRYS ER 20 MEQ PO TBCR
40.0000 meq | EXTENDED_RELEASE_TABLET | Freq: Once | ORAL | Status: AC
  Administered 2016-04-07: 40 meq via ORAL
  Filled 2016-04-07: qty 2

## 2016-04-07 NOTE — Progress Notes (Signed)
[facility] [place] NAME: Jonathan Bean    MR#:  [id number]  DATE OF BIRTH:  1925-12-13  SUBJECTIVE:  CHIEF COMPLAINT:   Chief Complaint  Patient presents with  . Fever  . Weakness  . Nasal Congestion  sleepy. Caregiver at bedside. K 2.9 REVIEW OF SYSTEMS:  Review of Systems  Constitutional: Positive for malaise/fatigue. Negative for fever, weight loss and diaphoresis.  HENT: Negative for ear discharge, ear pain, hearing loss, nosebleeds, sore throat and tinnitus.   Eyes: Negative for blurred vision and pain.  Respiratory: Negative for cough, hemoptysis, shortness of breath and wheezing.   Cardiovascular: Negative for chest pain, palpitations, orthopnea and leg swelling.  Gastrointestinal: Negative for heartburn, nausea, vomiting, abdominal pain, diarrhea, constipation and blood in stool.  Genitourinary: Negative for dysuria, urgency and frequency.  Musculoskeletal: Negative for myalgias and back pain.  Skin: Negative for itching and rash.  Neurological: Positive for weakness. Negative for dizziness, tingling, tremors, focal weakness, seizures and headaches.  Psychiatric/Behavioral: Negative for depression. The patient is not nervous/anxious.    DRUG ALLERGIES:   Allergies  Allergen Reactions  . Penicillins Other (See Comments)    Daughter states deadly   VITALS:  Blood pressure 136/82, pulse 78, temperature 97.8 F (36.6 C), temperature source Oral, resp. rate 18, height 5\' 7"  (1.702 m), weight 73.029 kg (161 lb), SpO2 95 %. PHYSICAL EXAMINATION:  Physical Exam  Constitutional: He is oriented to person, place, and time and well-developed, well-nourished, and in no distress.  HENT:  Head: Normocephalic and atraumatic.  Eyes: Conjunctivae and EOM are normal. Pupils are equal, round, and reactive to light.  Neck: Normal range of motion. Neck supple. No tracheal deviation present. No thyromegaly present.  Cardiovascular:  Normal rate, regular rhythm and normal heart sounds.   Pulmonary/Chest: Effort normal and breath sounds normal. No respiratory distress. He has no wheezes. He exhibits no tenderness.  Abdominal: Soft. Bowel sounds are normal. He exhibits no distension. There is no tenderness.  Musculoskeletal: Normal range of motion.  Neurological: He is alert and oriented to person, place, and time. No cranial nerve deficit.  Skin: Skin is warm and dry. No rash noted.  Psychiatric: Mood and affect normal.   LABORATORY PANEL:   CBC  Recent Labs Lab 04/07/16 0636  WBC 6.1  HGB 13.6  HCT 39.0*  PLT 175   ------------------------------------------------------------------------------------------------------------------ Chemistries   Recent Labs Lab 04/03/16 0327 04/07/16 0636  NA 139 141  K 3.7 2.9*  CL 107 102  CO2 28 32  GLUCOSE 134* 126*  BUN 17 20  CREATININE 0.59* 0.48*  CALCIUM 8.5* 8.8*  MG  --  1.9  AST 46*  --   ALT 16*  --   ALKPHOS 54  --   BILITOT 0.7  --    RADIOLOGY:  No results found. ASSESSMENT AND PLAN:  * Acute respiratory failure: - Present on admission due to pneumonia - Now resolved  * Hypokalemia - Replete and recheck - K 2.9  * Community acquired pneumonia - Treated with antibiotics - 1 more day of oral Levaquin  * Metabolic encephalopathy - Likely due to pneumonia - Now close to baseline mental status  * Parkinson's disease with Alzheimer's dementia - Highly functional - Follows with Dr. Manuella Ghazi at neurology outpatient - Continue Namenda, Aricept and primidone  * PSVT, PAF - Rate is controlled with metoprolol - Monitor on telemetry  * Dysphagia due to decreased LOC - taking meds  crushed in apple sauce   Physical therapy recommends short-term rehabilitation, although patient and family would prefer going home as he is already followed by home health services by [facility]  All the records are reviewed and case discussed with Care Management/Social  Worker. Management plans discussed with the patient, family and they are in agreement.  CODE STATUS: Full code  TOTAL TIME TAKING CARE OF THIS PATIENT: 35 minutes.   More than 50% of the time was spent in counseling/coordination of care: YES  POSSIBLE D/C IN AM, DEPENDING ON CLINICAL CONDITION.   [facility], Odalis Jordan M.D on 04/07/2016 at 2:55 PM  Between 7am to 6pm - Pager - [phone]  After 6pm go to [email or web address] - password [email or web address] [facility]  Office  [phone]  CC: Primary care physician; BABAOFF, Caryl Bis, MD  Note: This dictation was prepared with Dragon dictation along with smaller phrase technology. Any transcriptional errors that result from this process are unintentional.

## 2016-04-08 DIAGNOSIS — R4182 Altered mental status, unspecified: Secondary | ICD-10-CM

## 2016-04-08 LAB — BASIC METABOLIC PANEL
Anion gap: 5 (ref 5–15)
BUN: 21 mg/dL — ABNORMAL HIGH (ref 6–20)
CHLORIDE: 103 mmol/L (ref 101–111)
CO2: 33 mmol/L — AB (ref 22–32)
CREATININE: 0.51 mg/dL — AB (ref 0.61–1.24)
Calcium: 8.9 mg/dL (ref 8.9–10.3)
GFR calc non Af Amer: >60 mL/min (ref >60)
Glucose, Bld: 130 mg/dL — ABNORMAL HIGH (ref 65–99)
POTASSIUM: 3.3 mmol/L — AB (ref 3.5–5.1)
Sodium: 141 mmol/L (ref 135–145)

## 2016-04-08 LAB — CBC
HEMATOCRIT: 38.6 % — AB (ref 40.0–52.0)
HEMOGLOBIN: 13.5 g/dL (ref 13.0–18.0)
MCH: 34.7 pg — AB (ref 26.0–34.0)
MCHC: 35 g/dL (ref 32.0–36.0)
MCV: 99 fL (ref 80.0–100.0)
Platelets: 216 10*3/uL (ref 150–440)
RBC: 3.9 MIL/uL — ABNORMAL LOW (ref 4.40–5.90)
RDW: 12.1 % (ref 11.5–14.5)
WBC: 7.3 10*3/uL (ref 3.8–10.6)

## 2016-04-08 MED ORDER — PRIMIDONE 50 MG PO TABS
50.0000 mg | ORAL_TABLET | Freq: Every day | ORAL | Status: DC
  Administered 2016-04-08: 50 mg via ORAL
  Filled 2016-04-08: qty 1

## 2016-04-08 MED ORDER — POTASSIUM CHLORIDE 20 MEQ PO PACK
40.0000 meq | PACK | ORAL | Status: AC
  Administered 2016-04-08 (×2): 40 meq via ORAL
  Filled 2016-04-08 (×2): qty 2

## 2016-04-08 MED ORDER — POLYETHYLENE GLYCOL 3350 17 G PO PACK
17.0000 g | PACK | Freq: Every day | ORAL | Status: DC
  Administered 2016-04-09: 17 g via ORAL
  Filled 2016-04-08: qty 1

## 2016-04-08 MED ORDER — POTASSIUM CHLORIDE CRYS ER 20 MEQ PO TBCR
40.0000 meq | EXTENDED_RELEASE_TABLET | ORAL | Status: DC
  Filled 2016-04-08: qty 2

## 2016-04-08 MED ORDER — DOCUSATE SODIUM 50 MG/5ML PO LIQD
100.0000 mg | Freq: Two times a day (BID) | ORAL | Status: DC
  Administered 2016-04-08 – 2016-04-09 (×2): 100 mg via ORAL
  Filled 2016-04-08 (×4): qty 10

## 2016-04-08 NOTE — Care Management (Signed)
Met with daughter at bedside to discuss discharge plan. She confirmed that she has declined SNF and would like to take patient back home.  Per attending patient should be ready for DC tomorrow. Daughter updated. Patient currently has home health PT through [facility] and is happy with the service. Patient would benefit from nursing and HHA. Daughter agreeable. He has a walker, BSC and shower chair. No DME needed. TC to [facility] to update them on anticipated discharge tomorrow with SN, PT and HHA. Will meet with daughter in the morning to refine plans.

## 2016-04-08 NOTE — Progress Notes (Signed)
Speech Language Pathology Treatment: Dysphagia  Patient Details Name: Jonathan Bean MRN: [id number] DOB: [birth year] Today's Date: 04/08/2016 Time: 1000-1100 SLP Time Calculation (min) (ACUTE ONLY): 60 min  Assessment / Plan / Recommendation Clinical Impression  Pt seen for po trials of Honey consistency liquids in order to upgrade diet consistency. Caregiver present.    HPI HPI: Pt is an 82 year old male with a history of Alzheimer's disease, Parkinson's disease, Lewybody dementia presents with pneumonia intubated for respiratory distress extubated on 04/03/16. He resides at home w/ total care assistance. He appears to have been tolerating his Dys. 1 w/ Pudding consistnecy liquids per NSg report. Caregiver present today.      SLP Plan  Continue with current plan of care     Recommendations  Diet recommendations: Dysphagia 1 (puree);Honey-thick liquid Liquids provided via: Teaspoon Medication Administration: Crushed with puree Supervision: Full supervision/cueing for compensatory strategies Compensations: Slow rate;Small sips/bites;Hard cough after swallow;Follow solids with liquid;Minimize environmental distractions Postural Changes and/or Swallow Maneuvers: Out of bed for meals;Seated upright 90 degrees             General recommendations:  (Dietician) Oral Care Recommendations: Oral care QID;Staff/trained caregiver to provide oral care Follow up Recommendations: Skilled Nursing facility (TBD) Plan: Continue with current plan of care     [place], [place], CCC-SLP  Ayat Drenning 04/08/2016, 4:19 PM

## 2016-04-08 NOTE — Progress Notes (Signed)
Physical Therapy Treatment Patient Details Name: Jonathan Bean MRN: [id number] DOB: 09-Dec-1925 Today's Date: 04/08/2016    History of Present Illness 82 yo male with onset of PNA with PMHx:  lewy body dementia, Parkinson's, apraxia,     PT Comments    Pt demonstrated severe weakness and significant lethargy during this session limiting his ability to participate in therapy. Daughter was present during session. Pt required max A +2 to perform bed mobility. Despite verbal, visual and tactile cues pt was unable to assist much due to weakness and cognition. When sitting at EOB he demonstrates a significant L lean and posterior weight shift, decreased ability to hold his head up and drooling from the mouth. Various verbal, visual and tactile cues given to patient to correct these things, with little ability for him to do so. Multiple STS attempted with +2 max A and FWW from an elevated EOB with pt being unable to fully stand upright. Various hand placements and techniques were attempted to improve pt's standing ability, with no successful stand completed. Pt began to fatigue and require even more assistance just to sit at the EOB, and was returned to supine position. At this time pt will require lift equipment to safely complete a transfer. Pt's daughter continually made suggestions during the session and explained how they do things at home. PT attempted these suggestions if they were appropriate and safe. Pt's daughter also wanted to attempt standing him by herself. PT appropriately educated daughter that that isn't safe (for the pt or her) at this time due to his weakness, lethargy and inability to stand with PT and PT tech. Dr. Doy Mince entered pt's room towards the end of the session. PT explained that this is a gradual process to regain strength and mobility. Session ended with daughter discussing the pt with Dr. Doy Mince and pt's RN. STR is still recommended at this time.  Follow Up  Recommendations  SNF     Equipment Recommendations  None recommended by PT    Recommendations for Other Services       Precautions / Restrictions Precautions Precautions: Fall Restrictions Weight Bearing Restrictions: No    Mobility  Bed Mobility Overal bed mobility: +2 for physical assistance;Needs Assistance Bed Mobility: Supine to Sit;Sit to Supine     Supine to sit: Max assist;+2 for physical assistance;HOB elevated Sit to supine: Max assist;+2 for physical assistance;HOB elevated   General bed mobility comments: pt unable to assist much due to weakness and cognitive impairements despite verbal, visual and tactile cues for technique  Transfers Overall transfer level: Needs assistance Equipment used: Rolling walker (2 wheeled) Transfers: Sit to/from Stand Sit to Stand: Max assist;+2 physical assistance;From elevated surface Stand pivot transfers: Max assist;+2 physical assistance;From elevated surface       General transfer comment: pt was unable to stand fully upright with +2 max assistance, strong posterior lean and forward flexed trunk  Ambulation/Gait             General Gait Details: NT secondary to inability to fully stand with +2 max assistance   Stairs            Wheelchair Mobility    Modified Rankin (Stroke Patients Only)       Balance Overall balance assessment: Needs assistance Sitting-balance support: Bilateral upper extremity supported;Feet supported Sitting balance-Leahy Scale: Zero Sitting balance - Comments: unable to maintain balance, required mod A +2 to maintain despite cues for postural correction   Standing balance support: Bilateral upper extremity supported  Standing balance-Leahy Scale: Zero Standing balance comment: unable to stand fully with +2 max A                    Cognition Arousal/Alertness: Lethargic Behavior During Therapy: Flat affect Overall Cognitive Status: History of cognitive impairments - at  baseline                      Exercises Other Exercises Other Exercises: Bed mobility performed with +2 max assistance. Pt sat at EOB ~ 15 minutes total requiring mod A +2 to maintain. Cues for leaning to the R and postural correction to improve balance. STS trials attempted from EOB. Other Exercises: Mutiple attempts for STS from elevated EOB with FWW and max A +2. Pt demonstrated strong forward flexed posture with strong posterior weight shift, severely limiting ability to stand. Despite verbal, visual and tactile cues, pt was unable to correct these things to increase his ability to perform complete stand.     General Comments General comments (skin integrity, edema, etc.): c/o headache with mobility, RN notified      Pertinent Vitals/Pain Pain Assessment:  (c/o headache but unable to rate)    Home Living                      Prior Function            PT Goals (current goals can now be found in the care plan section) Acute Rehab PT Goals PT Goal Formulation: With family Time For Goal Achievement: 04/20/16 Potential to Achieve Goals: Fair Progress towards PT goals: Not progressing toward goals - comment    Frequency  Min 2X/week    PT Plan Current plan remains appropriate    Co-evaluation             End of Session Equipment Utilized During Treatment: Gait belt;Oxygen Activity Tolerance: Patient limited by fatigue;Patient limited by lethargy Patient left: in bed;with call bell/phone within reach;with family/visitor present     Time: [id number] PT Time Calculation (min) (ACUTE ONLY): 29 min  Charges:  $Therapeutic Activity: 23-37 mins                    G Codes:      Neoma Laming, PT, DPT  04/08/2016, 3:45 PM [phone]

## 2016-04-08 NOTE — NC FL2 (Signed)
[place] LEVEL OF CARE SCREENING TOOL     IDENTIFICATION  Patient Name: Jonathan Bean Birthdate: 1926/08/19 Sex: male Admission Date (Current Location): 04/01/2016  [place] and [place] Number:  Engineering geologist and Address:  [facility], [address], [place], [place] [address]      Provider Number: [id number]  Attending Physician Name and Address:  Hillary Bow, MD  Relative Name and Phone Number:       Current Level of Care: Hospital Recommended Level of Care: [place] Prior Approval Number:    Date Approved/Denied:   PASRR Number:  ([id number])  Discharge Plan: SNF    Current Diagnoses: Patient Active Problem List   Diagnosis Date Noted  . Acute on chronic respiratory failure with hypercapnia ([place])   . Pneumonia 04/01/2016  . Alzheimer's disease 02/09/2015  . Lewy body dementia 02/09/2015  . Apraxia 02/09/2015    Orientation RESPIRATION BLADDER Height & Weight     Self  O2 (Nasal Cannula 2 (L/min) ) Incontinent Weight: 161 lb (73.029 kg) Height:  5\' 7"  (170.2 cm)  BEHAVIORAL SYMPTOMS/MOOD NEUROLOGICAL BOWEL NUTRITION STATUS   (None)  (None) Incontinent Diet (DYS 1: Pudding Thick )  AMBULATORY STATUS COMMUNICATION OF NEEDS Skin   Extensive Assist Verbally Normal                       Personal Care Assistance Level of Assistance  Bathing, Feeding, Dressing Bathing Assistance: Limited assistance Feeding assistance: Independent Dressing Assistance: Limited assistance     Functional Limitations Info  Sight, Hearing, Speech Sight Info: Adequate Hearing Info: Adequate Speech Info: Adequate    SPECIAL CARE FACTORS FREQUENCY  PT (By licensed PT)     PT Frequency:  (5)              Contractures      Additional Factors Info  Code Status, Allergies Code Status Info:  (Full Code) Allergies Info:  (Penicillins)           Current Medications (04/08/2016):  This is the  current hospital active medication list Current Facility-Administered Medications  Medication Dose Route Frequency Provider Last Rate Last Dose  . acetaminophen (TYLENOL) tablet 650 mg  650 mg Oral Q4H PRN Wilhelmina Mcardle, MD   650 mg at 04/07/16 1324  . antiseptic oral rinse (CPC / CETYLPYRIDINIUM CHLORIDE 0.05%) solution 7 mL  7 mL Mouth Rinse q12n4p Laverle Hobby, MD   7 mL at 04/07/16 1726  . aspirin chewable tablet 81 mg  81 mg Oral Daily Bincy S Varughese, NP   81 mg at 04/07/16 1008  . chlorhexidine (PERIDEX) 0.12 % solution 15 mL  15 mL Mouth Rinse BID Laverle Hobby, MD   15 mL at 04/07/16 2135  . donepezil (ARICEPT) tablet 10 mg  10 mg Oral QHS Bincy S Varughese, NP   10 mg at 04/07/16 2134   And  . memantine (NAMENDA XR) 24 hr capsule 28 mg  28 mg Oral Daily Bincy S Varughese, NP   28 mg at 04/07/16 2134  . enoxaparin (LOVENOX) injection 40 mg  40 mg Subcutaneous Q24H Wilhelmina Mcardle, MD   40 mg at 04/07/16 1009  . haloperidol (HALDOL) tablet 1 mg  1 mg Oral Q4H PRN Theodoro Grist, MD   1 mg at 04/05/16 1848  . hydrALAZINE (APRESOLINE) injection 10 mg  10 mg Intravenous Q4H PRN Rush Farmer, MD   10 mg at 04/07/16 0528  .  ipratropium-albuterol (DUONEB) 0.5-2.5 (3) MG/3ML nebulizer solution 3 mL  3 mL Nebulization Q4H PRN Wilhelmina Mcardle, MD      . LORazepam (ATIVAN) tablet 1 mg  1 mg Oral Q4H PRN Theodoro Grist, MD   1 mg at 04/07/16 0303  . metoprolol (LOPRESSOR) injection 2.5-5 mg  2.5-5 mg Intravenous Q3H PRN Wilhelmina Mcardle, MD   2.5 mg at 04/03/16 0935  . metoprolol tartrate (LOPRESSOR) tablet 25 mg  25 mg Oral BID Wilhelmina Mcardle, MD   25 mg at 04/07/16 2134  . potassium chloride (KLOR-CON) packet 40 mEq  40 mEq Oral Q4H Srikar Sudini, MD      . primidone (MYSOLINE) tablet 50 mg  50 mg Per Tube QHS Wilhelmina Mcardle, MD   50 mg at 04/07/16 2134  . primidone (MYSOLINE) tablet 75 mg  75 mg Oral BH-q7a Bincy S Varughese, NP   75 mg at 04/08/16 0548  . vitamin B-12  (CYANOCOBALAMIN) tablet 1,000 mcg  1,000 mcg Oral Daily Holley Raring, NP   1,000 mcg at 04/07/16 1008     Discharge Medications: Please see discharge summary for a list of discharge medications.  Relevant Imaging Results:  Relevant Lab Results:   Additional Information  (SSN [id number])  Lorenso Quarry Willi [place], LCSW

## 2016-04-08 NOTE — Progress Notes (Signed)
[facility] NAME: Jonathan Bean    MR#:  [id number]  DATE OF BIRTH:  1926-11-02  SUBJECTIVE:  CHIEF COMPLAINT:   Chief Complaint  Patient presents with  . Fever  . Weakness  . Nasal Congestion   Drowzy. Wakes up on calling. Daughter and caregiver at bedside.  Patient has been on dysphagia diet with pudding thick liquids. Confused.  Daughter mentions patient is ambulatory at home. Has some memory problems but able to carry on with activities of daily living well.  REVIEW OF SYSTEMS:  Review of Systems  Constitutional: Positive for malaise/fatigue. Negative for fever, weight loss and diaphoresis.  HENT: Negative for ear discharge, ear pain, hearing loss, nosebleeds, sore throat and tinnitus.   Eyes: Negative for blurred vision and pain.  Respiratory: Negative for cough, hemoptysis, shortness of breath and wheezing.   Cardiovascular: Negative for chest pain, palpitations, orthopnea and leg swelling.  Gastrointestinal: Negative for heartburn, nausea, vomiting, abdominal pain, diarrhea, constipation and blood in stool.  Genitourinary: Negative for dysuria, urgency and frequency.  Musculoskeletal: Negative for myalgias and back pain.  Skin: Negative for itching and rash.  Neurological: Positive for weakness. Negative for dizziness, tingling, tremors, focal weakness, seizures and headaches.  Psychiatric/Behavioral: Negative for depression. The patient is not nervous/anxious.    DRUG ALLERGIES:   Allergies  Allergen Reactions  . Penicillins Other (See Comments)    Daughter states deadly   VITALS:  Blood pressure 133/80, pulse 59, temperature 98.6 F (37 C), temperature source Axillary, resp. rate 16, height 5\' 7"  (1.702 m), weight 73.029 kg (161 lb), SpO2 96 %. PHYSICAL EXAMINATION:  Physical Exam  Constitutional: He is well-developed, well-nourished, and in no distress.  HENT:  Head: Normocephalic and  atraumatic.  Eyes: Conjunctivae and EOM are normal. Pupils are equal, round, and reactive to light.  Neck: Normal range of motion. Neck supple. No tracheal deviation present. No thyromegaly present.  Cardiovascular: Normal rate, regular rhythm and normal heart sounds.   Pulmonary/Chest: Effort normal. No respiratory distress. He has no wheezes. He exhibits no tenderness.  Right-sided rhonchi  Abdominal: Soft. Bowel sounds are normal. He exhibits no distension. There is no tenderness.  Musculoskeletal: Normal range of motion.  Neurological:  Drowsy. Confused. Motor strength 3/5 in lower extremities. 4/5 in upper extremities.  Skin: Skin is warm and dry. No rash noted.  Psychiatric: Mood and affect normal.   LABORATORY PANEL:   CBC  Recent Labs Lab 04/08/16 0410  WBC 7.3  HGB 13.5  HCT 38.6*  PLT 216   ------------------------------------------------------------------------------------------------------------------ Chemistries   Recent Labs Lab 04/03/16 0327 04/07/16 0636 04/08/16 0410  NA 139 141 141  K 3.7 2.9* 3.3*  CL 107 102 103  CO2 28 32 33*  GLUCOSE 134* 126* 130*  BUN 17 20 21*  CREATININE 0.59* 0.48* 0.51*  CALCIUM 8.5* 8.8* 8.9  MG  --  1.9  --   AST 46*  --   --   ALT 16*  --   --   ALKPHOS 54  --   --   BILITOT 0.7  --   --    RADIOLOGY:  No results found. ASSESSMENT AND PLAN:   * Parkinson's disease? with Alzheimer's dementia Has worsening weakness and dysphagia. - Highly functional - Follows with Dr. Manuella Ghazi at neurology outpatient - Continue Namenda, Aricept and primidone - Request of Dr. Doy Mince with neurology to see the patient. Discussed case with her.  * Acute  respiratory failure: - Present on admission due to pneumonia - Now Improving - Finished Abx.  * Hypokalemia - Replete and recheck - K 3.3  * Community acquired pneumonia - Treated with antibiotics - 1 more day of oral Levaquin  * Metabolic encephalopathy - Likely due to  pneumonia - Improving - Patient does have inpatient delirium over his dementia.  * PSVT, PAF - Rate is controlled with metoprolol - Monitor on telemetry  * Dysphagia due to decreased LOC - taking meds crushed in apple sauce  Physical therapy evaluation. Daughter would like patient to go home with home health. Would consider skilled nursing facility.  Discussed with family at bedside and Education officer, museum.  Prolonged discussion with daughter. Answered all questions.  CODE STATUS: Full code  TOTAL TIME TAKING CARE OF THIS PATIENT: 35 minutes.   More than 50% of the time was spent in counseling/coordination of care: YES   Possible discharge in 1-2 days.   Hillary Bow R M.D on 04/08/2016 at 12:48 PM  Between 7am to 6pm - Pager - [phone]  After 6pm go to [email or web address] - password [email or web address]  Office  [phone]  CC: Primary care physician; BABAOFF, Caryl Bis, MD  Note: This dictation was prepared with Dragon dictation along with smaller phrase technology. Any transcriptional errors that result from this process are unintentional.

## 2016-04-08 NOTE — Consult Note (Addendum)
Reason for Consult: Altered mental status Referring Physician: Sudini  CC: Altered mental status  HPI: Jonathan Bean is an 82 y.o. male with a history of seizures, dementia and PD who was admitted on 04/01/2016 with PNA requiring intubation.  Patient was extubated two days later and per daughter seemed to be doing fairly well over the weekend until Saturday night.  Patient has not been sleeping well.  Hallucinates and talks in his sleep at night.  Is agitated at night.  Has been less alert.  Has been unable to get up and ambulate which he was able to do at home without an assistive device.    Past Medical History  Diagnosis Date  . Alzheimer's dementia   . Apraxia   . Lewy body dementia   . Parkinson's disease ([place])   . Hypertension     History reviewed. No pertinent past surgical history.  Family History  Problem Relation Age of Onset  . Dementia Mother     Social History:  reports that he quit smoking about 26 years ago. His smoking use included Pipe. He does not have any smokeless tobacco history on file. His alcohol and drug histories are not on file.  Allergies  Allergen Reactions  . Penicillins Other (See Comments)    Daughter states deadly    Medications:  I have reviewed the patient's current medications. Prior to Admission:  Prescriptions prior to admission  Medication Sig Dispense Refill Last Dose  . Arginine 500 MG CAPS Take 1,000 mg by mouth every morning.   03/31/2016 at Unknown time  . aspirin EC 81 MG tablet Take 81 mg by mouth daily.   03/31/2016 at Unknown time  . cholecalciferol (VITAMIN D) 1000 UNITS tablet Take 1,000 Units by mouth daily.   03/31/2016 at Unknown time  . fluconazole (DIFLUCAN) 100 MG tablet Take 100 mg by mouth once a week.   03/27/2016 at evening  . fluticasone (FLONASE) 50 MCG/ACT nasal spray Place 1 spray into both nostrils daily.   Past Week at Unknown time  . Memantine HCl-Donepezil HCl (NAMZARIC) 28-10 MG CP24 Take 1 tablet by mouth at  bedtime.   03/31/2016 at Unknown time  . primidone (MYSOLINE) 50 MG tablet Take 75 mg by mouth every morning.    04/01/2016 at Unknown time  . primidone (MYSOLINE) 50 MG tablet Take 50 mg by mouth at bedtime.   03/31/2016 at Unknown time  . vitamin B-12 (CYANOCOBALAMIN) 1000 MCG tablet Take 1,000 mcg by mouth daily.   03/31/2016 at Unknown time  . traMADol (ULTRAM) 50 MG tablet Take 50 mg by mouth daily as needed.      Scheduled: . antiseptic oral rinse  7 mL Mouth Rinse q12n4p  . aspirin  81 mg Oral Daily  . chlorhexidine  15 mL Mouth Rinse BID  . docusate  100 mg Oral BID  . donepezil  10 mg Oral QHS   And  . memantine  28 mg Oral Daily  . enoxaparin (LOVENOX) injection  40 mg Subcutaneous Q24H  . metoprolol tartrate  25 mg Oral BID  . polyethylene glycol  17 g Oral Daily  . primidone  50 mg Oral QHS  . primidone  75 mg Oral BH-[id number]  . vitamin B-12  1,000 mcg Oral Daily    ROS: History obtained from the patient  General ROS: negative for - chills, fatigue, fever, night sweats, weight gain or weight loss Psychological ROS: negative for - behavioral disorder, hallucinations, memory difficulties, mood swings  or suicidal ideation Ophthalmic ROS: negative for - blurry vision, double vision, eye pain or loss of vision ENT ROS: negative for - epistaxis, nasal discharge, oral lesions, sore throat, tinnitus or vertigo Allergy and Immunology ROS: negative for - hives or itchy/watery eyes Hematological and Lymphatic ROS: negative for - bleeding problems, bruising or swollen lymph nodes Endocrine ROS: negative for - galactorrhea, hair pattern changes, polydipsia/polyuria or temperature intolerance Respiratory ROS: negative for - cough, hemoptysis, shortness of breath or wheezing Cardiovascular ROS: negative for - chest pain, dyspnea on exertion, edema or irregular heartbeat Gastrointestinal ROS: negative for - abdominal pain, diarrhea, hematemesis, nausea/vomiting or stool  incontinence Genito-Urinary ROS: negative for - dysuria, hematuria, incontinence or urinary frequency/urgency Musculoskeletal ROS: negative for - joint swelling or muscular weakness Neurological ROS: as noted in HPI, headache Dermatological ROS: negative for rash and skin lesion changes  Physical Examination: Blood pressure 133/80, pulse 59, temperature 98.6 F (37 C), temperature source Axillary, resp. rate 16, height 5\' 7"  (1.702 m), weight 73.029 kg (161 lb), SpO2 96 %.  HEENT-  Normocephalic, no lesions, without obvious abnormality.  Normal external eye and conjunctiva.  Normal TM's bilaterally.  Normal auditory canals and external ears. Normal external nose, mucus membranes and septum.  Normal pharynx. Cardiovascular- S1, S2 normal, pulses palpable throughout   Lungs- chest clear, no wheezing, rales, normal symmetric air entry Abdomen- soft, non-tender; bowel sounds normal; no masses,  no organomegaly Extremities- no edema Lymph-no adenopathy palpable Musculoskeletal-pain on palpation of head and shoulders Skin-warm and dry, no hyperpigmentation, vitiligo, or suspicious lesions  Neurological Examination Mental Status: Lethargic but arousable.  Can follow some simple commands, particularly using the right.  Speech fluent but dysarthric.   Cranial Nerves: II: Discs flat bilaterally; Blinks to bilateral confrontation, pupils equal, round, reactive to light and accommodation III,IV, VI: ptosis not present, extra-ocular motions intact bilaterally V,VII: decrease in left NLF, facial light touch sensation normal bilaterally VIII: hearing normal bilaterally IX,X: gag reflex reduced XI: unable to perform XII: minimal tongue extension Motor: Moves RUE and RLE above gravity to command.  Moves the LUE and LLE but barely able to get off the bed.   Sensory: Pinprick and light touch intact throughout, bilaterally Deep Tendon Reflexes: 2+ in the upper extremities and absent in the lower  extremities Plantars: Right: mute   Left: mute Cerebellar: Unable to perform Gait: leans to the left with sitting.  Unable to stand.     Laboratory Studies:   Basic Metabolic Panel:  Recent Labs Lab 04/02/16 0103 04/03/16 0327 04/07/16 0636 04/08/16 0410  NA 139 139 141 141  K 3.2* 3.7 2.9* 3.3*  CL 101 107 102 103  CO2 25 28 32 33*  GLUCOSE 193* 134* 126* 130*  BUN 16 17 20  21*  CREATININE 0.77 0.59* 0.48* 0.51*  CALCIUM 8.4* 8.5* 8.8* 8.9  MG 1.6*  --  1.9  --     Liver Function Tests:  Recent Labs Lab 04/03/16 0327  AST 46*  ALT 16*  ALKPHOS 54  BILITOT 0.7  PROT 5.7*  ALBUMIN 3.2*   No results for input(s): LIPASE, AMYLASE in the last 168 hours. No results for input(s): AMMONIA in the last 168 hours.  CBC:  Recent Labs Lab 04/02/16 0103 04/03/16 0327 04/07/16 0636 04/08/16 0410  WBC 6.7 6.7 6.1 7.3  HGB 14.5 13.1 13.6 13.5  HCT 42.6 38.5* 39.0* 38.6*  MCV 100.7* 101.5* 100.5* 99.0  PLT 108* 94* 175 216    Cardiac Enzymes:  No results for input(s): CKTOTAL, CKMB, CKMBINDEX, TROPONINI in the last 168 hours.  BNP: Invalid input(s): POCBNP  CBG:  Recent Labs Lab 04/02/16 0104 04/02/16 0729 04/04/16 0735 04/04/16 2219  GLUCAP 185* 172* 42 116*    Microbiology: Results for orders placed or performed during the hospital encounter of 04/01/16  Blood Culture (routine x 2)     Status: None   Collection Time: 04/01/16 11:12 AM  Result Value Ref Range Status   Specimen Description BLOOD RIGHT AC  Final   Special Requests   Final    BOTTLES DRAWN AEROBIC AND ANAEROBIC AER 3ML ANA 2ML   Culture NO GROWTH 5 DAYS  Final   Report Status 04/06/2016 FINAL  Final  Blood Culture (routine x 2)     Status: None   Collection Time: 04/01/16 11:12 AM  Result Value Ref Range Status   Specimen Description BLOOD LEFT WRIST  Final   Special Requests   Final    BOTTLES DRAWN AEROBIC AND ANAEROBIC AER 3ML ANA 2ML   Culture NO GROWTH 5 DAYS  Final    Report Status 04/06/2016 FINAL  Final  Urine culture     Status: None   Collection Time: 04/01/16 11:12 AM  Result Value Ref Range Status   Specimen Description URINE, RANDOM  Final   Special Requests NONE  Final   Culture NO GROWTH 1 DAY  Final   Report Status 04/02/2016 FINAL  Final  MRSA PCR Screening     Status: None   Collection Time: 04/02/16 12:53 AM  Result Value Ref Range Status   MRSA by PCR NEGATIVE NEGATIVE Final    Comment:        The GeneXpert MRSA Assay (FDA approved for NASAL specimens only), is one component of a comprehensive MRSA colonization surveillance program. It is not intended to diagnose MRSA infection nor to guide or monitor treatment for MRSA infections.   Culture, respiratory (NON-Expectorated)     Status: None   Collection Time: 04/02/16  3:41 AM  Result Value Ref Range Status   Specimen Description TRACHEAL ASPIRATE  Final   Special Requests NONE  Final   Gram Stain FEW WBC SEEN FEW GRAM POSITIVE COCCI   Final   Culture Consistent with normal respiratory flora.  Final   Report Status 04/04/2016 FINAL  Final    Coagulation Studies: No results for input(s): LABPROT, INR in the last 72 hours.  Urinalysis: No results for input(s): COLORURINE, LABSPEC, PHURINE, GLUCOSEU, HGBUR, BILIRUBINUR, KETONESUR, PROTEINUR, UROBILINOGEN, NITRITE, LEUKOCYTESUR in the last 168 hours.  Invalid input(s): APPERANCEUR  Lipid Panel:     Component Value Date/Time   TRIG 80 04/02/2016 0103    HgbA1C:  Lab Results  Component Value Date   HGBA1C 6.7* 11/10/2013    Urine Drug Screen:  No results found for: LABOPIA, COCAINSCRNUR, LABBENZ, AMPHETMU, THCU, LABBARB  Alcohol Level: No results for input(s): ETH in the last 168 hours.  Other results: EKG: normal sinus rhythm at 107 bpm.  Imaging: No results found.   Assessment/Plan: 83 year old male with dementia, seizures and PD who who was admitted on 04/01/2016 with PNA requiring intubation.  Patient was  extubated two days later and per daughter seemed to be doing fairly well over the weekend until Saturday night.  Patient now with some mild left sided findings on examination and not as alert.  Can not rule out the possibility of a small right acute infarct.  Discussed diagnostic options with daughter and based  on treatment options and patient's current level of discomfort, will be conservative at this time.  No imaging at this time.  Patient BUN rising at this time, possibly suggesting some dehydration which may be contributing to mental status as well.    Recommendations: 1.  D/C Lovenox 2.  SCD's 3.  Continue ASA 4.  Continued PT   Alexis Goodell, MD Neurology [phone] 04/08/2016, 3:18 PM

## 2016-04-08 NOTE — Progress Notes (Signed)
I returned the patient's room on daughter's request. Caregiver at bedside. Patient still drowsy.  Discussed at length with daughter regarding patient's pneumonia and finishing the course of antibiotics. Also discussed regarding patient's worsening weakness and dysphagia. Explained likely inpatient delirium with acute illness over his baseline dementia. She has requested a neurology consult and I discussed with Dr. Doy Mince of neurology. Discussed with speech therapy.  I also discussed regarding patient's CODE STATUS. Patient is a full code.  Total time spent was 20 minutes in discussing at bedside.

## 2016-04-08 NOTE — Clinical Social Work Note (Signed)
Clinical Social Work Assessment  Patient Details  Name: Jonathan Bean MRN: [id number] Date of Birth: 08/23/1926  Date of referral:  04/08/16               Reason for consult:  Discharge Planning                Permission sought to share information with:  Family Supports Permission granted to share information::  Yes, Verbal Permission Granted  Name::        Agency::     Relationship::   ([place] - Daughter )  Contact Information:     Housing/Transportation Living arrangements for the past 2 months:  Single Family Home Source of Information:  Patient Patient Interpreter Needed:  None Criminal Activity/Legal Involvement Pertinent to Current Situation/Hospitalization:  No - Comment as needed Significant Relationships:  Adult Children, Other Family Members Lives with:  Adult Children Do you feel safe going back to the place where you live?  Yes Need for family participation in patient care:  Yes (Comment) ([place] - Daugher )  Care giving concerns:  Patient is from home. PT recommended SNF.    Social Worker assessment / plan:  CSW was consulted by PT stating that patient would benefit from SNF placement. CSW met with patient and his daugher at bedside. Patient was asleep in bed. Per his daguther patient has demintia. She reports that she feels that SNF placement would not be appropriate for patient. Stated she feels that he has a lot of support from family. Per daughter she recently moved in with patient and ensure that his need are met. She also reports that other family helps her to care for patient. Reported that patient receives HH services through [facility] and would like them to resume at discharge. She thanked CSW for assistance but declined SNF placement. There are no other CSW needs at this time. CSW is signing off but is available if a CSW need were to arise.   Employment status:  Retired Insurance information:  Medicare PT Recommendations:  Skilled Nursing  Facility Information / Referral to community resources:  Skilled Nursing Facility  Patient/Family's Response to care:  Patient's daughter feel it's best for patient to return home. She reports she feels it'll be best for his diagnosis of dementia.   Patient/Family's Understanding of and Emotional Response to Diagnosis, Current Treatment, and Prognosis:  Patient's daughter was greatful for CSW coming to speak to her about treatment options. Reports that she understands patient's diagnosis and feels that she'll be able to care for him at him with assistance of HH and her family.   Emotional Assessment Appearance:  Appears stated age Attitude/Demeanor/Rapport:   (None) Affect (typically observed):  Calm, Pleasant Orientation:  Oriented to Self Alcohol / Substance use:  Not Applicable Psych involvement (Current and /or in the community):  No (Comment)  Discharge Needs  Concerns to be addressed:  Discharge Planning Concerns Readmission within the last 30 days:  No Current discharge risk:  Chronically ill Barriers to Discharge:  Continued Medical Work up    E , LCSW 04/08/2016, 1:42 PM  

## 2016-04-08 NOTE — Progress Notes (Addendum)
Pt. Not ambulated R/T to being to weak and needs to walk with therapy 1st. PT works with pt today and is requesting SNF- placement for rehab.

## 2016-04-08 NOTE — Progress Notes (Signed)
Alert and times, only oriented to self. Patient has slept for a lot of the day, but is easily arousable. Does follow commands. Vitals are stable. Patient was unable to stand with PT, so nursing staff has not attempted to get patient out of the bed per order. Patient has complained of a headache twice today, treated with tylenol. Will continue to monitor.

## 2016-04-08 NOTE — Progress Notes (Signed)
Chaplain rounded the unit and provided a compassionate presence and support. Chaplain Rockey Guarino [phone] 

## 2016-04-09 MED ORDER — ACETAMINOPHEN 325 MG PO TABS: 650.0000 mg | ORAL_TABLET | ORAL | Status: AC | PRN

## 2016-04-09 MED ORDER — METOPROLOL SUCCINATE ER 25 MG PO TB24: 25.0000 mg | ORAL_TABLET | Freq: Every day | ORAL | Status: DC

## 2016-04-09 MED ORDER — FLEET ENEMA 7-19 GM/118ML RE ENEM
1.0000 | ENEMA | Freq: Once | RECTAL | Status: AC
  Administered 2016-04-09: 1 via RECTAL

## 2016-04-09 MED ORDER — IPRATROPIUM-ALBUTEROL 0.5-2.5 (3) MG/3ML IN SOLN: 3.0000 mL | Freq: Four times a day (QID) | RESPIRATORY_TRACT | Status: AC | PRN

## 2016-04-09 NOTE — Care Management Important Message (Signed)
Important Message  Patient Details  Name: Jonathan Bean MRN: [id number] Date of Birth: 1926/10/05   Medicare Important Message Given:  Yes    [name] 04/09/2016, 2:58 PM

## 2016-04-09 NOTE — Care Management Important Message (Signed)
Important Message  Patient Details  Name: Jonathan Bean MRN: [id number] Date of Birth: 09/09/1926   Medicare Important Message Given:  Yes    Jolly Mango, RN 04/09/2016, 3:09 PM

## 2016-04-09 NOTE — Progress Notes (Signed)
Discharge instructions are ready for patient to leave. Daughter would like physical therapy to come back again before patient leaves. Attempted to call Mindy with PT with no response, another therapist on the floor stated she is at lunch. Will attempt again. Care management still has to complete home needs. Daughter states they need a hospital bed there before the patient can go home. Patient will need transport by EMS. Will follow up with care management for discharge plans.

## 2016-04-09 NOTE — Care Management (Signed)
Patient requires repositioning of the body that cannot be achieved with a normal bed or wedge pillow. Patient requires the head of the bed to be elevated more than 30 degrees due to PNA, Lewybody dementia and Alzheimers disease. Patient requires frequent changes in body position.

## 2016-04-09 NOTE — Progress Notes (Signed)
EMS has arrived and is transporting patient now. Daughter at bedside. AVS given to her along with education for metoprolol, diet restrictions, aspiration precautions, nebulizer treatments, and pneumonia. IV and tele removed.

## 2016-04-09 NOTE — Progress Notes (Signed)
Heidi with PT has been by to see patient and she gave daughter documents for home exercises. Care management has completed home health needs and hospital bed will be delivered today. Enema was given per Dr. Boykin Reaper order a couple of hours ago, but patient has only had a smear of stool so far. Recommended to daughter to continue miralax and stool softeners at home which she states she already has for him. Patient is now ready for transport and EMS has been called. Will remove telemetry and IV once they arrive. EMS stated there are three people in front of him, so it may be a while. Will continue to monitor.

## 2016-04-09 NOTE — Discharge Instructions (Addendum)
°  DIET:  Cardiac diet  Diet Discharge Recommendations: Dysphagia 1 w/ Honey consistency liquids; strict aspiration precautions. Feeding assistance at meals - reduce distractions, effortful swallows.  Meds in Puree - Crushed is recommended. Condiments to moisten and flavor well.    DISCHARGE CONDITION:  Stable  ACTIVITY:  Activity as tolerated  OXYGEN:  Home Oxygen: No   Oxygen Delivery: None  DISCHARGE LOCATION:  Home with home health   If you experience worsening of your admission symptoms, develop shortness of breath, life threatening emergency, suicidal or homicidal thoughts you must seek medical attention immediately by calling 911 or calling your MD immediately  if symptoms less severe.  You Must read complete instructions/literature along with all the possible adverse reactions/side effects for all the Medicines you take and that have been prescribed to you. Take any new Medicines after you have completely understood and accpet all the possible adverse reactions/side effects.   Please note  You were cared for by a hospitalist during your hospital stay. If you have any questions about your discharge medications or the care you received while you were in the hospital after you are discharged, you can call the unit and asked to speak with the hospitalist on call if the hospitalist that took care of you is not available. Once you are discharged, your primary care physician will handle any further medical issues. Please note that NO REFILLS for any discharge medications will be authorized once you are discharged, as it is imperative that you return to your primary care physician (or establish a relationship with a primary care physician if you do not have one) for your aftercare needs so that they can reassess your need for medications and monitor your lab values.

## 2016-04-09 NOTE — Progress Notes (Signed)
Speech Language Pathology Treatment:  (education on dysphagia diet, prep, food options, ordering )  Patient Details Name: Jonathan Bean MRN: [id number] DOB: [birth year] Today's Date: 04/09/2016 Time: 2423-5361 SLP Time Calculation (min) (ACUTE ONLY): 30 min  Assessment / Plan / Recommendation Clinical Impression  Met w/ pt and pt's Dtr this PM. Pt is preparing to return home w/ HH and 24 hour caregiver assistance w/ Dtr. This session was spent w/ Dtr giving education on dysphagia diet, prep explanation, food options, ordering of products; and discussion of the importance of continuing to follow strict aspiration precautions as he has done in the hospital w/ po's. Handouts given on all of the above. Dtr will f/u w/ [place] and ST services for assessment of appropriateness for upgrade of diet consistency. Discussed risks for aspiration and feeding support/environment. Dtr agreed.    HPI HPI: Pt is an 82 year old male with a history of Alzheimer's disease, Parkinson's disease, Lewybody dementia presents with pneumonia intubated for respiratory distress extubated on 04/03/16. He resides at home w/ total care assistance. He appears to have been tolerating his Dys. 1 w/ Pudding consistnecy liquids per NSg report. Caregiver present today.      SLP Plan  Continue with current plan of care (pt discharging today home w/ [facility])     Recommendations  Diet recommendations: Dysphagia 1 (puree);Honey-thick liquid Liquids provided via: Teaspoon;Cup;No straw Medication Administration: Crushed with puree Supervision: Full supervision/cueing for compensatory strategies Compensations: Minimize environmental distractions;Slow rate;Small sips/bites;Lingual sweep for clearance of pocketing;Multiple dry swallows after each bite/sip;Follow solids with liquid (rest breaks when needed) Postural Changes and/or Swallow Maneuvers: Out of bed for meals;Seated upright 90 degrees;Upright 30-60 min after meal             General recommendations:  (Dietician f/u) Oral Care Recommendations: Oral care QID;Staff/trained caregiver to provide oral care Follow up Recommendations: Home health SLP;24 hour supervision/assistance Plan: Continue with current plan of care (pt discharging today home w/ HH)     GO               Jonathan Kenner, MS, CCC-SLP  Bean,Jonathan 04/09/2016, 5:17 PM

## 2016-04-09 NOTE — Care Management (Signed)
Received signed important medicare notice back from grandson, Jonathan Bean. TC to explain the "Detail Notice of Discharge". Faxed document to [phone] to [facility]. Await return signed copy.

## 2016-04-09 NOTE — Progress Notes (Addendum)
Physical Therapy Treatment Patient Details Name: Jonathan Bean MRN: [id number] DOB: 1926-10-09 Today's Date: 04/09/2016    History of Present Illness 82 yo male with onset of PNA with PMHx:  lewy body dementia, Parkinson's, apraxia,     PT Comments    Lengthy discussion with pt's daughter regarding current issues pt facing and expectations as well as placement in skilled nursing facility versus home at this time. Daughter educated on PT role in current setting and recommendations made based on current level of function as well as respecting pt/family wishes for therapy. Discussed possibility of pt's function not returning to baseline due to nature of pre existing illness despite best efforts. Daughter did wish pt awoken and PT attempted. Pt did sit edge of bed with Max A of 2 with Min to Mod A to maintain upright balance. Pt does follow instructions for seated exercises fairly well with assist needed on the left. Pt required personal hygiene and bed pad change; total to Max A x 2 for partial stand. Difficulty comprehending use of rolling walker, as pt does not typically use a rolling walker. Pt later returned to bed with total assist to reposition upward. Daughter provided with written exercises as requested. Daughter as several other questions regarding posture and use of braces; questions answered and suggestions provided to allow positioning and greater use of pt's own musculature to attempt strengthening. Daughter receptive. Pt has current orders for discharge home at this time with home health aide and PT and possibly nursing as well.   Follow Up Recommendations  SNF (Daughter wishes to take pt home)     Equipment Recommendations  Other (comment) (daughter notes hospital bed ordered; has wheelchair)    Recommendations for Other Services       Precautions / Restrictions Precautions Precautions: Fall Restrictions Weight Bearing Restrictions: No    Mobility  Bed Mobility Overal  bed mobility: Needs Assistance;+2 for physical assistance Bed Mobility: Supine to Sit;Sit to Supine     Supine to sit: Max assist;+2 for physical assistance Sit to supine: Max assist;+2 for physical assistance;HOB elevated   General bed mobility comments: Pt agreeable; attempts to assist legs off edge of bed with tactile cues. Also attempts to elevate trunk from sidlying, but too weak for productive assist. Dependent back to bed and reposition upward   Transfers Overall transfer level: Needs assistance Equipment used: Rolling walker (2 wheeled) Transfers: Sit to/from Stand Sit to Stand: Total assist;+2 physical assistance;Max assist         General transfer comment: 2 attempts from elevated surface, 1st attempt pt lifting R foot more than standing making total assist. Second attempt improved foot placement with Max of 2 to stand to allow bed change. Unable to attain full stand either time  Ambulation/Gait             General Gait Details: unable   Stairs            Wheelchair Mobility    Modified Rankin (Stroke Patients Only)       Balance Overall balance assessment: Needs assistance Sitting-balance support: Feet supported;Single extremity supported;Bilateral upper extremity supported Sitting balance-Leahy Scale: Poor Sitting balance - Comments: Pt requires Min to Mod A to maintain upright sitting balance; assist to use left arm to self assist . Pt does respond to cues to correct heavy lean to the left as well as upright posture, but also requires physical assist Postural control: Left lateral lean;Posterior lean Standing balance support: Bilateral upper extremity supported Standing  balance-Leahy Scale: Zero Standing balance comment: Requires Max A x 2; unable to attain full upright posture                    Cognition Arousal/Alertness: Lethargic Behavior During Therapy: WFL for tasks assessed/performed Overall Cognitive Status: History of cognitive  impairments - at baseline       Memory: Decreased recall of precautions;Decreased short-term memory              Exercises General Exercises - Lower Extremity Long Arc Quad: AROM;Right;Seated;10 reps (partial on L AROM; full with AAROM 10x) Hip Flexion/Marching: AROM;Right;10 reps;Seated (AAROM om L) Toe Raises: AROM;Both;15 reps;Seated Heel Raises: AROM;Both;15 reps;Seated Other Exercises Other Exercises: Daughter given written exercises with education    General Comments General comments (skin integrity, edema, etc.): neck pain with mobility      Pertinent Vitals/Pain Pain Assessment:  (does not quantify, several c/o neck pain)    Home Living                      Prior Function            PT Goals (current goals can now be found in the care plan section) Progress towards PT goals: Progressing toward goals    Frequency  Min 2X/week    PT Plan Current plan remains appropriate    Co-evaluation             End of Session Equipment Utilized During Treatment: Gait belt Activity Tolerance: Patient limited by fatigue;Patient limited by lethargy (profound weakness) Patient left: in bed;with call bell/phone within reach;with bed alarm set     Time: 1430-1515 PT Time Calculation (min) (ACUTE ONLY): 45 min  Charges:  $Therapeutic Exercise: 8-22 mins $Therapeutic Activity: 23-37 mins                    G CodesCharlaine Dalton, PTA 04/09/2016, 4:47 PM

## 2016-04-09 NOTE — Care Management (Signed)
Ordered hospital bed and nebulizer from [facility]. Notified [facility] of discharge. Spoke with daughter, updated her on discharge plan. Questions answered. No further needs anticipated.

## 2016-04-11 NOTE — Discharge Summary (Signed)
[facility] [place] NAME: Jonathan Bean    MR#:  [id number]  DATE OF BIRTH:  1926/05/02  DATE OF ADMISSION:  04/01/2016 ADMITTING PHYSICIAN: Vaughan Basta, MD  DATE OF DISCHARGE: 04/09/2016  5:32 PM  PRIMARY CARE PHYSICIAN: BABAOFF, MARC E, MD   ADMISSION DIAGNOSIS:  Lactic acidosis [E87.2] Pneumonia [J18.9] Weakness [R53.1] Bronchitis [J40] Fever, unspecified fever cause [R50.9]  DISCHARGE DIAGNOSIS:  Principal Problem:   Pneumonia Active Problems:   Acute on chronic respiratory failure with hypercapnia ([place])   SECONDARY DIAGNOSIS:   Past Medical History  Diagnosis Date  . Alzheimer's dementia   . Apraxia   . Lewy body dementia   . Parkinson's disease ([place])   . Hypertension      ADMITTING HISTORY  HISTORY OF PRESENT ILLNESS: Jonathan Bean is a 82 y.o. male with a known history of Present illness dementia, and a body dementia, Parkinson's disease, hypertension-lives with daughter, for last 2 days had some congestion and cough and gradually became more drowsy. He also had some fever last night and nonproductive cough and concerned with his daughter brought him to the emergency room. In ER he was noted to have fever up to 101F, and some findings of atelectasis or pneumonia on chest x-ray so given broad-spectrum antibiotics and given to hospitalist team for admission.  Patient is drowsy, easily arousable, but confused so not able to give me any history, and all these details obtained from his daughter on the phone.   HOSPITAL COURSE:   * Parkinson's disease? with Alzheimer's dementia Has worsening weakness and dysphagia. - Follows with Dr. Manuella Ghazi at neurology outpatient - Continue Namenda, Aricept and primidone - Patient was seen by Dr. Doy Mince of neurology. Advised outpatient follow-up with neurology. She did discuss with patient's daughter regarding imaging studies although this would not help in changing  any medications. The daughter decided not to pursue any aggressive imaging studies per her discussion with Dr. Doy Mince. - Patient's symptoms are multifactorial mainly due to acute infection and worsening dementia.  * Acute respiratory failure: - Present on admission due to pneumonia - Now Improving. On day of discharge saturations are 93% on room air. - Finished Abx.  * Hypokalemia - Repleted  * Community acquired pneumonia - Finished 1 week of antibiotics in the hospital.  * Metabolic encephalopathy - Likely due to pneumonia - Improving - Patient does have inpatient delirium over his dementia.  * PSVT - Rate is controlled with metoprolol - Monitor on telemetry  * Dysphagia due to decreased LOC - taking meds crushed in apple sauce  Overall patient is significantly improved from admission. His progressing back to baseline. Recommendation was to discharge home to skilled nursing facility. But daughter wondered take patient home and we have set up home health.  CONSULTS OBTAINED:  Treatment Team:  Catarina Hartshorn, MD  DRUG ALLERGIES:   Allergies  Allergen Reactions  . Penicillins Other (See Comments)    Daughter states deadly    DISCHARGE MEDICATIONS:   Discharge Medication List as of 04/09/2016 12:53 PM    START taking these medications   Details  acetaminophen (TYLENOL) 325 MG tablet Take 2 tablets (650 mg total) by mouth every 4 (four) hours as needed for mild pain or fever., Starting 04/09/2016, Until Discontinued, OTC    ipratropium-albuterol (DUONEB) 0.5-2.5 (3) MG/3ML SOLN Take 3 mLs by nebulization every 6 (six) hours as needed (Shortness of breath)., Starting 04/09/2016, Until Discontinued, Normal    metoprolol  succinate (TOPROL XL) 25 MG 24 hr tablet Take 1 tablet (25 mg total) by mouth daily., Starting 04/09/2016, Until Discontinued, Normal      CONTINUE these medications which have NOT CHANGED   Details  Arginine 500 MG CAPS Take 1,000 mg by mouth every  morning., Until Discontinued, Historical Med    aspirin EC 81 MG tablet Take 81 mg by mouth daily., Until Discontinued, Historical Med    cholecalciferol (VITAMIN D) 1000 UNITS tablet Take 1,000 Units by mouth daily., Until Discontinued, Historical Med    fluconazole (DIFLUCAN) 100 MG tablet Take 100 mg by mouth once a week., Until Discontinued, Historical Med    fluticasone (FLONASE) 50 MCG/ACT nasal spray Place 1 spray into both nostrils daily., Until Discontinued, Historical Med    Memantine HCl-Donepezil HCl (NAMZARIC) 28-10 MG CP24 Take 1 tablet by mouth at bedtime., Until Discontinued, Historical Med    !! primidone (MYSOLINE) 50 MG tablet Take 75 mg by mouth every morning. , Until Discontinued, Historical Med    !! primidone (MYSOLINE) 50 MG tablet Take 50 mg by mouth at bedtime., Until Discontinued, Historical Med    vitamin B-12 (CYANOCOBALAMIN) 1000 MCG tablet Take 1,000 mcg by mouth daily., Until Discontinued, Historical Med    traMADol (ULTRAM) 50 MG tablet Take 50 mg by mouth daily as needed., Until Discontinued, Historical Med     !! - Potential duplicate medications found. Please discuss with provider.      Today   VITAL SIGNS:  Blood pressure 136/76, pulse 80, temperature 98.3 F (36.8 C), temperature source Oral, resp. rate 18, height 5\' 7"  (1.702 m), weight 73.029 kg (161 lb), SpO2 92 %.  I/O:  No intake or output data in the 24 hours ending 04/11/16 1618  PHYSICAL EXAMINATION:  Physical Exam  GENERAL:  82 y.o.-year-old patient lying in the bed with no acute distress.  LUNGS: Normal breath sounds bilaterally, no wheezing, rales,rhonchi or crepitation.  CARDIOVASCULAR: S1, S2 normal. No murmurs, rubs, or gallops.  ABDOMEN: Soft, non-tender, non-distended. Bowel sounds present. No organomegaly or mass.  NEUROLOGIC: Moves all 4 extremities. PSYCHIATRIC: The patient is drowzy. Confused. Wakes up on calling his name  DATA REVIEW:   CBC  Recent Labs Lab  04/08/16 0410  WBC 7.3  HGB 13.5  HCT 38.6*  PLT 216    Chemistries   Recent Labs Lab 04/07/16 0636 04/08/16 0410  NA 141 141  K 2.9* 3.3*  CL 102 103  CO2 32 33*  GLUCOSE 126* 130*  BUN 20 21*  CREATININE 0.48* 0.51*  CALCIUM 8.8* 8.9  MG 1.9  --     Cardiac Enzymes No results for input(s): TROPONINI in the last 168 hours.  Microbiology Results  Results for orders placed or performed during the hospital encounter of 04/01/16  Blood Culture (routine x 2)     Status: None   Collection Time: 04/01/16 11:12 AM  Result Value Ref Range Status   Specimen Description BLOOD RIGHT [facility]  Final   Special Requests   Final    BOTTLES DRAWN AEROBIC AND ANAEROBIC AER 3ML ANA 2ML   Culture NO GROWTH 5 DAYS  Final   Report Status 04/06/2016 FINAL  Final  Blood Culture (routine x 2)     Status: None   Collection Time: 04/01/16 11:12 AM  Result Value Ref Range Status   Specimen Description BLOOD LEFT WRIST  Final   Special Requests   Final    BOTTLES DRAWN AEROBIC AND ANAEROBIC AER 3ML  ANA 2ML   Culture NO GROWTH 5 DAYS  Final   Report Status 04/06/2016 FINAL  Final  Urine culture     Status: None   Collection Time: 04/01/16 11:12 AM  Result Value Ref Range Status   Specimen Description URINE, RANDOM  Final   Special Requests NONE  Final   Culture NO GROWTH 1 DAY  Final   Report Status 04/02/2016 FINAL  Final  MRSA PCR Screening     Status: None   Collection Time: 04/02/16 12:53 AM  Result Value Ref Range Status   MRSA by PCR NEGATIVE NEGATIVE Final    Comment:        The GeneXpert MRSA Assay (FDA approved for NASAL specimens only), is one component of a comprehensive MRSA colonization surveillance program. It is not intended to diagnose MRSA infection nor to guide or monitor treatment for MRSA infections.   Culture, respiratory (NON-Expectorated)     Status: None   Collection Time: 04/02/16  3:41 AM  Result Value Ref Range Status   Specimen Description TRACHEAL  ASPIRATE  Final   Special Requests NONE  Final   Gram Stain FEW WBC SEEN FEW GRAM POSITIVE COCCI   Final   Culture Consistent with normal respiratory flora.  Final   Report Status 04/04/2016 FINAL  Final    RADIOLOGY:  No results found.  Follow up with PCP in 1 week.  Management plans discussed with the patient, family and they are in agreement.  CODE STATUS:  Code Status History    Date Active Date Inactive Code Status Order ID Comments User Context   04/01/2016  4:54 PM 04/09/2016  8:32 PM Full Code [id number]  Vaughan Basta, MD Inpatient      TOTAL TIME TAKING CARE OF THIS PATIENT ON DAY OF DISCHARGE: more than 30 minutes.   Hillary Bow R M.D on 04/11/2016 at 4:18 PM  Between 7am to 6pm - Pager - [phone]  After 6pm go to [email or web address] - password [email or web address] [facility]  Office  [phone]  CC: Primary care physician; BABAOFF, Caryl Bis, MD  Note: This dictation was prepared with Dragon dictation along with smaller phrase technology. Any transcriptional errors that result from this process are unintentional.

## 2016-07-16 ENCOUNTER — Other Ambulatory Visit: Payer: Self-pay | Admitting: Neurology

## 2016-07-16 DIAGNOSIS — F0280 Dementia in other diseases classified elsewhere without behavioral disturbance: Secondary | ICD-10-CM

## 2016-07-16 DIAGNOSIS — G309 Alzheimer's disease, unspecified: Principal | ICD-10-CM

## 2016-07-29 ENCOUNTER — Ambulatory Visit: Payer: Medicare PPO

## 2016-07-30 ENCOUNTER — Ambulatory Visit: Payer: Medicare PPO

## 2016-07-30 ENCOUNTER — Ambulatory Visit
Admission: RE | Admit: 2016-07-30 | Discharge: 2016-07-30 | Disposition: A | Discharge status: Home / Self Care | Payer: Medicare PPO | Source: Ambulatory Visit | Attending: Neurology | Admitting: Neurology

## 2016-07-30 DIAGNOSIS — G301 Alzheimer's disease with late onset: Secondary | ICD-10-CM | POA: Diagnosis not present

## 2016-07-30 DIAGNOSIS — G319 Degenerative disease of nervous system, unspecified: Secondary | ICD-10-CM | POA: Diagnosis not present

## 2016-07-30 DIAGNOSIS — I739 Peripheral vascular disease, unspecified: Secondary | ICD-10-CM | POA: Diagnosis not present

## 2016-07-30 DIAGNOSIS — F0280 Dementia in other diseases classified elsewhere without behavioral disturbance: Secondary | ICD-10-CM | POA: Insufficient documentation

## 2016-07-30 DIAGNOSIS — G309 Alzheimer's disease, unspecified: Secondary | ICD-10-CM

## 2016-08-16 ENCOUNTER — Ambulatory Visit (INDEPENDENT_AMBULATORY_CARE_PROVIDER_SITE_OTHER): Payer: Medicare PPO | Admitting: Urology

## 2016-08-16 ENCOUNTER — Encounter: Payer: Self-pay | Admitting: Urology

## 2016-08-16 VITALS — BP 108/66 | HR 40 | Ht 64.25 in | Wt 139.4 lb

## 2016-08-16 DIAGNOSIS — N400 Enlarged prostate without lower urinary tract symptoms: Secondary | ICD-10-CM

## 2016-08-16 MED ORDER — TAMSULOSIN HCL 0.4 MG PO CAPS: 0.4000 mg | ORAL_CAPSULE | Freq: Every day | ORAL | 11 refills | Status: DC

## 2016-08-16 NOTE — Addendum Note (Signed)
Addended by: Wilson Singer on: 08/16/2016 02:19 PM   Modules accepted: Orders

## 2016-08-16 NOTE — Progress Notes (Signed)
08/16/2016 12:23 PM   Su Hilt Jan 16, 1926 [id number]  Referring provider: Derinda Late, MD [phone] [facility] [place], [place] [address]  Chief Complaint  Patient presents with  . New Patient (Initial Visit)    frequent urination     HPI: The patient is a 82 year old gentleman with a past medical history of Parkinson's disease and Louis by dementia who presents for urinary frequency. He is unable to provide much of a history at this time but per his daughter he gets up 3-6 times per night to urinate. He urinates small volumes. Per the patient he does hesitate to start his urinary stream though I'm not sure reliable this is. His PVR is 167. This is been going on for 1-2 years and has been slowly worsening. His daughter as well as the patient I personally bothered by these urinary symptoms.He has never taken medication for an enlarged prostate before.     PMH: Past Medical History:  Diagnosis Date  . Alzheimer's dementia   . Apraxia   . Hypertension   . Lewy body dementia   . Parkinson's disease [facility])     Surgical History: No past surgical history on file.  Home Medications:    Medication List       Accurate as of 08/16/16 12:23 PM. Always use your most recent med list.          acetaminophen 325 MG tablet Commonly known as:  TYLENOL Take 2 tablets (650 mg total) by mouth every 4 (four) hours as needed for mild pain or fever.   Arginine 500 MG Caps Take 1,000 mg by mouth every morning.   aspirin EC 81 MG tablet Take 81 mg by mouth daily.   cholecalciferol 1000 units tablet Commonly known as:  VITAMIN D Take 1,000 Units by mouth daily.   cyclobenzaprine 5 MG tablet Commonly known as:  FLEXERIL   fluconazole 100 MG tablet Commonly known as:  DIFLUCAN Take 100 mg by mouth once a week.   fluticasone 50 MCG/ACT nasal spray Commonly known as:  FLONASE Place 1 spray into both nostrils daily.     ipratropium-albuterol 0.5-2.5 (3) MG/3ML Soln Commonly known as:  DUONEB Take 3 mLs by nebulization every 6 (six) hours as needed (Shortness of breath).   meloxicam 7.5 MG tablet Commonly known as:  MOBIC   metoprolol succinate 25 MG 24 hr tablet Commonly known as:  TOPROL XL Take 1 tablet (25 mg total) by mouth daily.   NAMZARIC 28-10 MG Cp24 Generic drug:  Memantine HCl-Donepezil HCl Take 1 tablet by mouth at bedtime.   predniSONE 10 MG tablet Commonly known as:  DELTASONE   primidone 50 MG tablet Commonly known as:  MYSOLINE Take 75 mg by mouth every morning.   primidone 50 MG tablet Commonly known as:  MYSOLINE Take 50 mg by mouth at bedtime.   tamsulosin 0.4 MG Caps capsule Commonly known as:  FLOMAX Take 1 capsule (0.4 mg total) by mouth at bedtime.   traMADol 50 MG tablet Commonly known as:  ULTRAM Take 50 mg by mouth daily as needed.   vitamin B-12 1000 MCG tablet Commonly known as:  CYANOCOBALAMIN Take 1,000 mcg by mouth daily.       Allergies:  Allergies  Allergen Reactions  . Citalopram Hives  . Penicillins Other (See Comments) and Swelling    Daughter states deadly    Family History: Family History  Problem Relation Age of Onset  .  Dementia Mother     Social History:  reports that he quit smoking about 26 years ago. His smoking use included Pipe. He does not have any smokeless tobacco history on file. His alcohol and drug histories are not on file.  ROS: unable to fully obtain from patient due patient's dementia                                        Physical Exam: BP 108/66   Pulse (!) 40   Ht 5' 4.25" (1.632 m)   Wt 139 lb 6.4 oz (63.2 kg)   BMI 23.74 kg/m   Constitutional:  Alert and oriented, No acute distress. HEENT: [place] AT, moist mucus membranes.  Trachea midline, no masses. Cardiovascular: No clubbing, cyanosis, or edema. Respiratory: Normal respiratory effort, no increased work of breathing. GI:  Abdomen is soft, nontender, nondistended, no abdominal masses GU: No CVA tenderness.  Skin: No rashes, bruises or suspicious lesions. Lymph: No cervical or inguinal adenopathy. Neurologic: Grossly intact, no focal deficits, moving all 4 extremities. Psychiatric: Normal mood and affect.  Laboratory Data: Lab Results  Component Value Date   WBC 7.3 04/08/2016   HGB 13.5 04/08/2016   HCT 38.6 (L) 04/08/2016   MCV 99.0 04/08/2016   PLT 216 04/08/2016    Lab Results  Component Value Date   CREATININE 0.51 (L) 04/08/2016    No results found for: PSA  No results found for: TESTOSTERONE  Lab Results  Component Value Date   HGBA1C 6.7 (H) 11/10/2013    Urinalysis    Component Value Date/Time   COLORURINE YELLOW (A) 04/01/2016 1112   APPEARANCEUR CLEAR (A) 04/01/2016 1112   APPEARANCEUR Clear 11/09/2013 1740   LABSPEC 1.022 04/01/2016 1112   LABSPEC 1.018 11/09/2013 1740   PHURINE 7.0 04/01/2016 1112   GLUCOSEU >500 (A) 04/01/2016 1112   GLUCOSEU Negative 11/09/2013 1740   HGBUR NEGATIVE 04/01/2016 1112   BILIRUBINUR NEGATIVE 04/01/2016 1112   BILIRUBINUR Negative 11/09/2013 1740   KETONESUR 1+ (A) 04/01/2016 1112   PROTEINUR 30 (A) 04/01/2016 1112   NITRITE NEGATIVE 04/01/2016 1112   LEUKOCYTESUR NEGATIVE 04/01/2016 1112   LEUKOCYTESUR Negative 11/09/2013 1740      Assessment & Plan:    I had a long discussion with the patient and his daughter regarding his BPH symptoms as well as the 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  We discussed the best way to treat both of these conditions would be to start Flomax on a daily basis. We did discuss the etiology of DSD and why he is at risk for that as well. All questions were answered.  1. BPH 2. Possible DSD -Start Flomax nightly. Patient's daughter was warned of the risk of more so static hypotension the risk for falls. They're instructed to stop this medication if this were to  occur. He'll follow-up in 3 months.   Return in about 3 months (around 11/15/2016).  Nickie Retort, MD  [facility] [address] [place], [place] [address] [phone]

## 2016-10-07 ENCOUNTER — Telehealth: Payer: Self-pay | Admitting: Urology

## 2016-10-07 NOTE — Telephone Encounter (Signed)
Patient said that they want your opinion on if he needs a stronger dosage of flomax. He is now getting up 3-4 times a night and before it was 4-6 times. She said you told her that his parkinson was maybe having an impact on this. She would like to know if they just need to maybe increase the dosage?  Please advise   Sharyn Lull

## 2016-10-09 NOTE — Telephone Encounter (Signed)
Can you call and discuss this with the patient in case he has further questions   michelle

## 2016-10-09 NOTE — Telephone Encounter (Signed)
Spoke with pt daughter in reference to flomax. Truman Hayward, daughter, voiced understanding.

## 2016-10-14 ENCOUNTER — Telehealth: Payer: Self-pay | Admitting: Urology

## 2016-10-14 DIAGNOSIS — N401 Enlarged prostate with lower urinary tract symptoms: Secondary | ICD-10-CM

## 2016-10-14 MED ORDER — TAMSULOSIN HCL 0.4 MG PO CAPS: 0.4000 mg | ORAL_CAPSULE | Freq: Every day | ORAL | 11 refills | Status: DC

## 2016-10-14 NOTE — Telephone Encounter (Signed)
Patient's daughter called to request that we change his prescription of Flomax to 2 pills per day.  He uses the [facility] mail order pharmacy.

## 2016-10-14 NOTE — Telephone Encounter (Signed)
Medication was sent to pharmacy.

## 2016-10-28 ENCOUNTER — Other Ambulatory Visit: Payer: Self-pay

## 2016-10-28 DIAGNOSIS — N401 Enlarged prostate with lower urinary tract symptoms: Secondary | ICD-10-CM

## 2016-10-28 MED ORDER — TAMSULOSIN HCL 0.4 MG PO CAPS: 0.4000 mg | ORAL_CAPSULE | Freq: Every day | ORAL | 11 refills | Status: DC

## 2016-11-04 ENCOUNTER — Telehealth: Payer: Self-pay | Admitting: Urology

## 2016-11-04 DIAGNOSIS — N401 Enlarged prostate with lower urinary tract symptoms: Secondary | ICD-10-CM

## 2016-11-04 MED ORDER — TAMSULOSIN HCL 0.4 MG PO CAPS: 0.4000 mg | ORAL_CAPSULE | Freq: Two times a day (BID) | ORAL | 11 refills | Status: DC

## 2016-11-04 NOTE — Telephone Encounter (Signed)
Medication was fixed.

## 2016-11-04 NOTE — Telephone Encounter (Signed)
Pt daughter called dosage was corrected with Kristopher Oppenheim but has not been corrected with his [employer] pharm. Truman Hayward wants confirmation that you have contacted [employer] and corrected the dosage as well. For Tamilosin. Please advise.

## 2016-11-15 ENCOUNTER — Encounter: Payer: Self-pay | Admitting: Urology

## 2016-11-15 ENCOUNTER — Ambulatory Visit: Payer: Medicare PPO | Admitting: Urology

## 2016-11-15 VITALS — BP 114/74 | HR 82 | Ht 64.25 in | Wt 142.5 lb

## 2016-11-15 DIAGNOSIS — N401 Enlarged prostate with lower urinary tract symptoms: Secondary | ICD-10-CM | POA: Diagnosis not present

## 2016-11-15 DIAGNOSIS — N138 Other obstructive and reflux uropathy: Secondary | ICD-10-CM

## 2016-11-15 DIAGNOSIS — N3644 Muscular disorders of urethra: Secondary | ICD-10-CM

## 2016-11-15 DIAGNOSIS — C84A0 Cutaneous T-cell lymphoma, unspecified, unspecified site: Secondary | ICD-10-CM | POA: Insufficient documentation

## 2016-11-15 LAB — BLADDER SCAN AMB NON-IMAGING: SCAN RESULT: 103

## 2016-11-15 MED ORDER — FINASTERIDE 5 MG PO TABS: 5.0000 mg | ORAL_TABLET | Freq: Every day | ORAL | 11 refills | Status: DC

## 2016-11-15 NOTE — Progress Notes (Signed)
11/15/2016 12:43 PM   Su Hilt [birth year] [id number]  Referring provider: Derinda Late, MD [phone] [facility] [place], [place] [address]  Chief Complaint  Patient presents with  . Benign Prostatic Hypertrophy    HPI: The patient is a 82 year old gentleman with a past medical history of Parkinson's disease and Louis body dementia who presents for urinary frequency. He is unable to provide much of a history at this time but per his daughter he gets up 3-6 times per night to urinate. He urinates small volumes. Per the patient he does hesitate to start his urinary stream though I'm not sure how reliable this is. His PVR is 167. This has been going on for 1-2 years and has been slowly worsening. His daughter as well as the patient are increasingly bothered by these urinary symptoms.He has never taken medication for an enlarged prostate before.    He was started on Flomax for BPH and possible DSD. He had partial response to this and within the last few weeks he is up to his dose to Flomax 0.8 mg daily. He has noted a marginal improvement from 4-6 times per night to 3 times per night. His biggest complaint stents to be nocturia. His daughter. She sleeps next one has to get him off which is bothersome to both of them. She does note that he occasionally snores at night. He does not do this every night.   PMH: Past Medical History:  Diagnosis Date  . Alzheimer's dementia   . Apraxia   . Hypertension   . Lewy body dementia   . Parkinson's disease [facility])     Surgical History: History reviewed. No pertinent surgical history.  Home Medications:  Allergies as of 11/15/2016      Reactions   Citalopram Hives   Penicillins Other (See Comments), Swelling   Daughter states deadly      Medication List       Accurate as of 11/15/16 12:43 PM. Always use your most recent med list.          acetaminophen 325 MG tablet Commonly known  as:  TYLENOL Take 2 tablets (650 mg total) by mouth every 4 (four) hours as needed for mild pain or fever.   Arginine 500 MG Caps Take 1,000 mg by mouth every morning.   aspirin EC 81 MG tablet Take 81 mg by mouth daily.   cholecalciferol 1000 units tablet Commonly known as:  VITAMIN D Take 1,000 Units by mouth daily.   cyclobenzaprine 5 MG tablet Commonly known as:  FLEXERIL   finasteride 5 MG tablet Commonly known as:  PROSCAR Take 1 tablet (5 mg total) by mouth daily.   fluconazole 100 MG tablet Commonly known as:  DIFLUCAN Take 100 mg by mouth once a week.   fluticasone 50 MCG/ACT nasal spray Commonly known as:  FLONASE Place 1 spray into both nostrils daily.   ipratropium-albuterol 0.5-2.5 (3) MG/3ML Soln Commonly known as:  DUONEB Take 3 mLs by nebulization every 6 (six) hours as needed (Shortness of breath).   meloxicam 7.5 MG tablet Commonly known as:  MOBIC   metoprolol succinate 25 MG 24 hr tablet Commonly known as:  TOPROL XL Take 1 tablet (25 mg total) by mouth daily.   NAMZARIC 28-10 MG Cp24 Generic drug:  Memantine HCl-Donepezil HCl Take 1 tablet by mouth at bedtime.   predniSONE 10 MG tablet Commonly known as:  DELTASONE   primidone 50 MG  tablet Commonly known as:  MYSOLINE Take 75 mg by mouth every morning.   primidone 50 MG tablet Commonly known as:  MYSOLINE Take 50 mg by mouth at bedtime.   tamsulosin 0.4 MG Caps capsule Commonly known as:  FLOMAX Take 1 capsule (0.4 mg total) by mouth 2 (two) times daily.   traMADol 50 MG tablet Commonly known as:  ULTRAM Take 50 mg by mouth daily as needed.   vitamin B-12 1000 MCG tablet Commonly known as:  CYANOCOBALAMIN Take 1,000 mcg by mouth daily.       Allergies:  Allergies  Allergen Reactions  . Citalopram Hives  . Penicillins Other (See Comments) and Swelling    Daughter states deadly    Family History: Family History  Problem Relation Age of Onset  . Dementia Mother      Social History:  reports that he quit smoking about 27 years ago. His smoking use included Pipe. He does not have any smokeless tobacco history on file. His alcohol and drug histories are not on file.  ROS: UROLOGY Frequent Urination?: Yes Hard to postpone urination?: No Burning/pain with urination?: No Get up at night to urinate?: Yes Leakage of urine?: No Urine stream starts and stops?: No Trouble starting stream?: No Do you have to strain to urinate?: No Blood in urine?: No Urinary tract infection?: No Sexually transmitted disease?: No Injury to kidneys or bladder?: No Painful intercourse?: No Weak stream?: No Erection problems?: No Penile pain?: No  Gastrointestinal Nausea?: No Vomiting?: No Indigestion/heartburn?: No Diarrhea?: Yes Constipation?: Yes  Constitutional Fever: No Night sweats?: No Weight loss?: Yes Fatigue?: No  Skin Skin rash/lesions?: No Itching?: Yes  Eyes Blurred vision?: No Double vision?: No  Ears/Nose/Throat Sore throat?: No Sinus problems?: Yes  Hematologic/Lymphatic Swollen glands?: No Easy bruising?: No  Cardiovascular Leg swelling?: No Chest pain?: Yes  Respiratory Cough?: Yes Shortness of breath?: No  Endocrine Excessive thirst?: Yes  Musculoskeletal Back pain?: Yes Joint pain?: Yes  Neurological Headaches?: Yes Dizziness?: Yes  Psychologic Depression?: No Anxiety?: Yes  Physical Exam: BP 114/74 (BP Location: Left Arm, Patient Position: Sitting, Cuff Size: Normal)   Pulse 82   Ht 5' 4.25" (1.632 m)   Wt 142 lb 8 oz (64.6 kg)   BMI 24.27 kg/m   Constitutional:  Alert and oriented, No acute distress. HEENT: [place] AT, moist mucus membranes.  Trachea midline, no masses. Cardiovascular: No clubbing, cyanosis, or edema. Respiratory: Normal respiratory effort, no increased work of breathing. GI: Abdomen is soft, nontender, nondistended, no abdominal masses GU: No CVA tenderness.  Skin: No rashes, bruises  or suspicious lesions. Lymph: No cervical or inguinal adenopathy. Neurologic: Grossly intact, no focal deficits, moving all 4 extremities. Psychiatric: Normal mood and affect.  Laboratory Data: Lab Results  Component Value Date   WBC 7.3 04/08/2016   HGB 13.5 04/08/2016   HCT 38.6 (L) 04/08/2016   MCV 99.0 04/08/2016   PLT 216 04/08/2016    Lab Results  Component Value Date   CREATININE 0.51 (L) 04/08/2016    No results found for: PSA  No results found for: TESTOSTERONE  Lab Results  Component Value Date   HGBA1C 6.7 (H) 11/10/2013    Urinalysis    Component Value Date/Time   COLORURINE YELLOW (A) 04/01/2016 1112   APPEARANCEUR CLEAR (A) 04/01/2016 1112   APPEARANCEUR Clear 11/09/2013 1740   LABSPEC 1.022 04/01/2016 1112   LABSPEC 1.018 11/09/2013 1740   PHURINE 7.0 04/01/2016 1112   GLUCOSEU >500 (A) 04/01/2016  [place] Negative 11/09/2013 [place] 04/01/2016 [place] 04/01/2016 1112   BILIRUBINUR Negative 11/09/2013 1740   KETONESUR 1+ (A) 04/01/2016 1112   PROTEINUR 30 (A) 04/01/2016 1112   NITRITE NEGATIVE 04/01/2016 1112   LEUKOCYTESUR NEGATIVE 04/01/2016 1112   LEUKOCYTESUR Negative 11/09/2013 1740    Assessment & Plan:    1. BPH 2. Possible DSD -continue flomax 0.8 mg daily -will add finasteride 5 mg daily.I discussed with the daughter that this will take up to 6 months to have its full effect. I also discussed that if his nocturia is his main complaint that this could be from sleep apnea. She is not interested in pursuing this as a potential route at this time though. She does understand that if sleep apnea is the cause of his symptoms that further medications will not improve his symptoms.  Return in about 6 months (around 05/16/2017).  Nickie Retort, MD  [facility] [address] [place], [place] [address] [phone]

## 2017-05-16 ENCOUNTER — Encounter: Payer: Self-pay | Admitting: Urology

## 2017-05-16 ENCOUNTER — Ambulatory Visit (INDEPENDENT_AMBULATORY_CARE_PROVIDER_SITE_OTHER): Payer: Medicare PPO | Admitting: Urology

## 2017-05-16 VITALS — BP 130/68 | HR 77 | Ht 64.25 in

## 2017-05-16 DIAGNOSIS — N400 Enlarged prostate without lower urinary tract symptoms: Secondary | ICD-10-CM

## 2017-05-16 NOTE — Progress Notes (Signed)
05/16/2017 3:08 PM   Su Hilt 06/11/1926 [id number]  Referring provider: Derinda Late, MD [phone] [address] [place], [place] [address]  Chief Complaint  Patient presents with  . Benign Prostatic Hypertrophy    HPI: The patient is a 83 year old gentleman with a past medical history of Parkinson's disease with Louis body dementia who presents for urinary frequency. He is unable to provide much of a history at this time but per his daughter he gets up 3-6 times per night to urinate. He urinates small volumes. Per the patient he does hesitate to start his urinary stream though I'm not sure how reliable this is. His PVR is 167. This has been going on for 1-2 years and has been slowly worsening. His daughter as well as the patient are increasingly bothered by these urinary symptoms.He has never taken medication for an enlarged prostate before.  He was started on Flomax for BPH and possible DSD. He had partial response to this and within the last few weeks he is up to his dose to Flomax 0.8 mg daily. He has noted a marginal improvement from 4-6 times per night to 3 times per night. His biggest complaint stents to be nocturia. His daughter. She sleeps next one has to get him off which is bothersome to both of them. She does note that he occasionally snores at night. He does not do this every night.    After his last visit, finasteride was added to his regimen. He has now been on this 6 months.They did not notice much improvement until last week when they're instructed take melatonin 3 mg nightly. The patient now can sleep through the night with melatonin however he does have episodes of incontinence into his diaper though this is not bothersome to his daughter. She is happy he is able to sleep through the night. They're interested in stopping some of his medications at this time.  At his last visit, he was recommended to undergo a sleep  study as his main complaint was nocturia. His daughter declined to have him undergo a sleep study at that time.   PMH: Past Medical History:  Diagnosis Date  . Alzheimer's dementia   . Apraxia   . Hypertension   . Lewy body dementia   . Parkinson's disease [facility])     Surgical History: History reviewed. No pertinent surgical history.  Home Medications:  Allergies as of 05/16/2017      Reactions   Citalopram Hives   Penicillins Other (See Comments), Swelling   Daughter states deadly      Medication List       Accurate as of 05/16/17  3:08 PM. Always use your most recent med list.          acetaminophen 325 MG tablet Commonly known as:  TYLENOL Take 2 tablets (650 mg total) by mouth every 4 (four) hours as needed for mild pain or fever.   Arginine 500 MG Caps Take 1,000 mg by mouth every morning.   aspirin EC 81 MG tablet Take 81 mg by mouth daily.   cholecalciferol 1000 units tablet Commonly known as:  VITAMIN D Take 1,000 Units by mouth daily.   cyclobenzaprine 5 MG tablet Commonly known as:  FLEXERIL   finasteride 5 MG tablet Commonly known as:  PROSCAR Take 1 tablet (5 mg total) by mouth daily.   fluconazole 100 MG tablet Commonly known as:  DIFLUCAN Take 100 mg by mouth  once a week.   fluticasone 50 MCG/ACT nasal spray Commonly known as:  FLONASE Place 1 spray into both nostrils daily.   ipratropium-albuterol 0.5-2.5 (3) MG/3ML Soln Commonly known as:  DUONEB Take 3 mLs by nebulization every 6 (six) hours as needed (Shortness of breath).   meloxicam 7.5 MG tablet Commonly known as:  MOBIC   metoprolol succinate 25 MG 24 hr tablet Commonly known as:  TOPROL XL Take 1 tablet (25 mg total) by mouth daily.   NAMZARIC 28-10 MG Cp24 Generic drug:  Memantine HCl-Donepezil HCl Take 1 tablet by mouth at bedtime.   predniSONE 10 MG tablet Commonly known as:  DELTASONE   primidone 50 MG tablet Commonly known as:  MYSOLINE Take 75 mg by mouth every  morning.   primidone 50 MG tablet Commonly known as:  MYSOLINE Take 50 mg by mouth at bedtime.   tamsulosin 0.4 MG Caps capsule Commonly known as:  FLOMAX Take 1 capsule (0.4 mg total) by mouth 2 (two) times daily.   traMADol 50 MG tablet Commonly known as:  ULTRAM Take 50 mg by mouth daily as needed.   vitamin B-12 1000 MCG tablet Commonly known as:  CYANOCOBALAMIN Take 1,000 mcg by mouth daily.       Allergies:  Allergies  Allergen Reactions  . Citalopram Hives  . Penicillins Other (See Comments) and Swelling    Daughter states deadly    Family History: Family History  Problem Relation Age of Onset  . Dementia Mother     Social History:  reports that he quit smoking about 27 years ago. His smoking use included Pipe. He has never used smokeless tobacco. His alcohol and drug histories are not on file.  ROS: UROLOGY Frequent Urination?: Yes Hard to postpone urination?: No Burning/pain with urination?: No Get up at night to urinate?: Yes Leakage of urine?: No Urine stream starts and stops?: No Trouble starting stream?: No Do you have to strain to urinate?: No Blood in urine?: No Urinary tract infection?: No Sexually transmitted disease?: No Injury to kidneys or bladder?: No Painful intercourse?: No Weak stream?: No Erection problems?: No Penile pain?: No  Gastrointestinal Nausea?: No Vomiting?: No Indigestion/heartburn?: No Diarrhea?: No Constipation?: Yes  Constitutional Fever: No Night sweats?: No Weight loss?: Yes Fatigue?: Yes  Skin Skin rash/lesions?: No Itching?: No  Eyes Blurred vision?: No Double vision?: No  Ears/Nose/Throat Sore throat?: No Sinus problems?: No  Hematologic/Lymphatic Swollen glands?: No Easy bruising?: Yes  Cardiovascular Leg swelling?: No Chest pain?: No  Respiratory Cough?: Yes Shortness of breath?: No  Endocrine Excessive thirst?: No  Musculoskeletal Back pain?: Yes Joint pain?:  Yes  Neurological Headaches?: Yes Dizziness?: Yes  Psychologic Depression?: No Anxiety?: No  Physical Exam: BP 130/68 (BP Location: Left Arm, Patient Position: Sitting, Cuff Size: Normal)   Pulse 77   Ht 5' 4.25" (1.632 m)   Constitutional:  Alert and oriented, No acute distress. HEENT: [place] AT, moist mucus membranes.  Trachea midline, no masses. Cardiovascular: No clubbing, cyanosis, or edema. Respiratory: Normal respiratory effort, no increased work of breathing. GI: Abdomen is soft, nontender, nondistended, no abdominal masses GU: No CVA tenderness.  Skin: No rashes, bruises or suspicious lesions. Lymph: No cervical or inguinal adenopathy. Neurologic: Grossly intact, no focal deficits, moving all 4 extremities. Psychiatric: Normal mood and affect.  Laboratory Data: Lab Results  Component Value Date   WBC 7.3 04/08/2016   HGB 13.5 04/08/2016   HCT 38.6 (L) 04/08/2016   MCV 99.0 04/08/2016  PLT 216 04/08/2016    Lab Results  Component Value Date   CREATININE 0.51 (L) 04/08/2016    No results found for: PSA  No results found for: TESTOSTERONE  Lab Results  Component Value Date   HGBA1C 6.7 (H) 11/10/2013    Urinalysis    Component Value Date/Time   COLORURINE YELLOW (A) 04/01/2016 1112   APPEARANCEUR CLEAR (A) 04/01/2016 1112   APPEARANCEUR Clear 11/09/2013 1740   LABSPEC 1.022 04/01/2016 1112   LABSPEC 1.018 11/09/2013 1740   PHURINE 7.0 04/01/2016 1112   GLUCOSEU >500 (A) 04/01/2016 1112   GLUCOSEU Negative 11/09/2013 1740   HGBUR NEGATIVE 04/01/2016 1112   BILIRUBINUR NEGATIVE 04/01/2016 1112   BILIRUBINUR Negative 11/09/2013 1740   KETONESUR 1+ (A) 04/01/2016 1112   PROTEINUR 30 (A) 04/01/2016 1112   NITRITE NEGATIVE 04/01/2016 1112   LEUKOCYTESUR NEGATIVE 04/01/2016 1112   LEUKOCYTESUR Negative 11/09/2013 1740    Assessment & Plan:    1. BPH 2. Possible DSD -It appears that taking melatonin has helped the patient improve his nocturia that  he does cause him to have incontinence which is daughter does not find bothersome to help him with. She is happy that he is able to sleep through the night. To continue his finasteride has insurance his prostate. They are interested in weaning his medications though since I think melatonin is really helping. They would like to start with weaning his Flomax. I did discuss that this may also be helping his possible DSD. I did suggest that they decrease Flomax to just 1 pill per day and monitor symptoms. If they worsen, I instructed him to restart the second polyp Flomax. If this is the same, they can try stopping Flomax altogether. Since his symptoms are relatively well controlled this time, we will have him follow up annually or sooner if a problem arises.  Return in about 1 year (around 05/16/2018).  Nickie Retort, MD  [facility] [address], [place], [place] [address] [phone]

## 2017-11-03 ENCOUNTER — Other Ambulatory Visit: Payer: Self-pay

## 2017-11-03 MED ORDER — FINASTERIDE 5 MG PO TABS: 5.0000 mg | ORAL_TABLET | Freq: Every day | ORAL | 3 refills | Status: DC

## 2017-12-16 ENCOUNTER — Other Ambulatory Visit: Payer: Self-pay

## 2017-12-16 DIAGNOSIS — N401 Enlarged prostate with lower urinary tract symptoms: Secondary | ICD-10-CM

## 2017-12-16 MED ORDER — TAMSULOSIN HCL 0.4 MG PO CAPS: 0.4000 mg | ORAL_CAPSULE | Freq: Two times a day (BID) | ORAL | 11 refills | Status: DC

## 2018-05-11 ENCOUNTER — Ambulatory Visit (INDEPENDENT_AMBULATORY_CARE_PROVIDER_SITE_OTHER): Payer: Medicare PPO

## 2018-05-11 VITALS — BP 105/66 | HR 91

## 2018-05-11 DIAGNOSIS — R310 Gross hematuria: Secondary | ICD-10-CM

## 2018-05-11 LAB — URINALYSIS, COMPLETE
Bilirubin, UA: NEGATIVE
GLUCOSE, UA: NEGATIVE
KETONES UA: NEGATIVE
LEUKOCYTES UA: NEGATIVE
Nitrite, UA: NEGATIVE
PROTEIN UA: NEGATIVE
RBC, UA: NEGATIVE
SPEC GRAV UA: 1.015 (ref 1.005–1.030)
Urobilinogen, Ur: 0.2 mg/dL (ref 0.2–1.0)
pH, UA: 7 (ref 5.0–7.5)

## 2018-05-11 LAB — MICROSCOPIC EXAMINATION: Epithelial Cells (non renal): NONE SEEN /hpf (ref 0–10)

## 2018-05-11 LAB — BLADDER SCAN AMB NON-IMAGING

## 2018-05-11 NOTE — Progress Notes (Signed)
Patient present today complaining of blood dripping from penis.  No blood noted in urine, blood seen only in underwear per patient's daughter. No other urinary symptoms noted. UA today did not show RBC today and does not show infection, will send for culture. Per Dr. Erlene Quan patient to return to the office for next available cysto for further evaluation.

## 2018-05-13 LAB — CULTURE, URINE COMPREHENSIVE

## 2018-05-14 ENCOUNTER — Telehealth: Payer: Self-pay

## 2018-05-14 NOTE — Telephone Encounter (Signed)
Please call patient's daughter to schedule , she was to call back the other day to do this and nothing is booked thanks

## 2018-05-14 NOTE — Telephone Encounter (Signed)
-----   Message from Nori Riis, PA-C sent at 05/13/2018  8:34 PM EDT ----- He needs to be scheduled for a cystoscopy.

## 2018-05-28 ENCOUNTER — Ambulatory Visit: Payer: Medicare PPO

## 2018-05-28 ENCOUNTER — Encounter: Payer: Self-pay | Admitting: Urology

## 2018-05-28 ENCOUNTER — Ambulatory Visit: Payer: Medicare PPO | Admitting: Urology

## 2018-05-28 VITALS — BP 107/66 | HR 61

## 2018-05-28 DIAGNOSIS — N4889 Other specified disorders of penis: Secondary | ICD-10-CM | POA: Diagnosis not present

## 2018-05-28 MED ORDER — LIDOCAINE HCL URETHRAL/MUCOSAL 2 % EX GEL
1.0000 "application " | Freq: Once | CUTANEOUS | Status: AC
  Administered 2018-05-28: 1 via URETHRAL

## 2018-05-28 MED ORDER — CIPROFLOXACIN HCL 500 MG PO TABS
500.0000 mg | ORAL_TABLET | Freq: Once | ORAL | Status: AC
  Administered 2018-05-28: 500 mg via ORAL

## 2018-05-28 NOTE — Progress Notes (Signed)
   05/28/18  CC:  Chief Complaint  Patient presents with  . Cysto    HPI: 84 year old male who has previously seen Dr. Pilar Jarvis for lower urinary tract symptoms.  He had a recent episode of penile bleeding without hematuria.  This was an isolated episode which has not recurred.  Blood pressure 107/66, pulse 61. NED. Alert   No respiratory distress     Cystoscopy Procedure Note  Patient identification was confirmed, informed consent was obtained, and patient was prepped using Betadine solution.  Lidocaine jelly was administered per urethral meatus.    Preoperative abx where received prior to procedure.     Pre-Procedure: - Inspection reveals a normal caliber ureteral meatus.  Procedure: The flexible cystoscope was introduced without difficulty - No urethral strictures/lesions are present.  Prominent hypervascularity of the bulbar urethra - Moderate lateral lobe enlargement, hypervascularity - Mild bladder neck elevation  - Bilateral ureteral orifices identified - Bladder mucosa  reveals no ulcers, tumors, or lesions - No bladder stones -Moderate trabeculation  Retroflexion shows no significant abnormalities   Post-Procedure: - Patient tolerated the procedure well  Assessment/ Plan: No significant abnormalities identified on cystoscopy.  The etiology of his penile bleeding most likely secondary to a ruptured superficial vessel in the urethra. Follow-up annually or as needed for recurrent bleeding.

## 2018-06-25 ENCOUNTER — Other Ambulatory Visit: Payer: Medicare PPO

## 2018-08-10 ENCOUNTER — Telehealth: Payer: Self-pay | Admitting: Urology

## 2018-08-10 NOTE — Telephone Encounter (Signed)
Patient's daughter called and stated that his mail order pharmacy said that they are delayed in refilling his finasteride and don't know how long it will take them to get it filled and mailed to him, they want to know if we can fill it for him for 90 days? She wants it sent to the [facility] in [place] on [address] She is leaving 08-21-18 to go out of the country and is trying to get all of his medications refilled before she leaves and he only has a couple of pills left.   Sharyn Lull

## 2018-08-11 MED ORDER — FINASTERIDE 5 MG PO TABS: 5.0000 mg | ORAL_TABLET | Freq: Every day | ORAL | 0 refills | Status: AC

## 2018-08-11 NOTE — Telephone Encounter (Signed)
rx sent

## 2018-09-12 ENCOUNTER — Other Ambulatory Visit: Payer: Self-pay

## 2018-09-12 ENCOUNTER — Inpatient Hospital Stay
Admission: EM | Admit: 2018-09-12 | Discharge: 2018-09-14 | DRG: 177 | Disposition: A | Discharge status: Home Health | Payer: Medicare PPO | Attending: Internal Medicine | Admitting: Internal Medicine

## 2018-09-12 ENCOUNTER — Encounter: Payer: Self-pay | Admitting: Emergency Medicine

## 2018-09-12 ENCOUNTER — Emergency Department: Payer: Medicare PPO

## 2018-09-12 DIAGNOSIS — Z881 Allergy status to other antibiotic agents status: Secondary | ICD-10-CM

## 2018-09-12 DIAGNOSIS — Z79890 Hormone replacement therapy (postmenopausal): Secondary | ICD-10-CM | POA: Diagnosis not present

## 2018-09-12 DIAGNOSIS — Z818 Family history of other mental and behavioral disorders: Secondary | ICD-10-CM | POA: Diagnosis not present

## 2018-09-12 DIAGNOSIS — Z880 Allergy status to penicillin: Secondary | ICD-10-CM | POA: Diagnosis not present

## 2018-09-12 DIAGNOSIS — E860 Dehydration: Secondary | ICD-10-CM | POA: Diagnosis present

## 2018-09-12 DIAGNOSIS — Z79899 Other long term (current) drug therapy: Secondary | ICD-10-CM

## 2018-09-12 DIAGNOSIS — N400 Enlarged prostate without lower urinary tract symptoms: Secondary | ICD-10-CM | POA: Diagnosis present

## 2018-09-12 DIAGNOSIS — J9601 Acute respiratory failure with hypoxia: Secondary | ICD-10-CM | POA: Diagnosis present

## 2018-09-12 DIAGNOSIS — R0689 Other abnormalities of breathing: Secondary | ICD-10-CM | POA: Diagnosis not present

## 2018-09-12 DIAGNOSIS — G3183 Dementia with Lewy bodies: Secondary | ICD-10-CM | POA: Diagnosis present

## 2018-09-12 DIAGNOSIS — N401 Enlarged prostate with lower urinary tract symptoms: Secondary | ICD-10-CM

## 2018-09-12 DIAGNOSIS — L8990 Pressure ulcer of unspecified site, unspecified stage: Secondary | ICD-10-CM

## 2018-09-12 DIAGNOSIS — R0902 Hypoxemia: Secondary | ICD-10-CM

## 2018-09-12 DIAGNOSIS — F0280 Dementia in other diseases classified elsewhere without behavioral disturbance: Secondary | ICD-10-CM | POA: Diagnosis present

## 2018-09-12 DIAGNOSIS — Z8673 Personal history of transient ischemic attack (TIA), and cerebral infarction without residual deficits: Secondary | ICD-10-CM | POA: Diagnosis not present

## 2018-09-12 DIAGNOSIS — J690 Pneumonitis due to inhalation of food and vomit: Principal | ICD-10-CM | POA: Diagnosis present

## 2018-09-12 DIAGNOSIS — Z66 Do not resuscitate: Secondary | ICD-10-CM | POA: Diagnosis present

## 2018-09-12 DIAGNOSIS — Z7982 Long term (current) use of aspirin: Secondary | ICD-10-CM

## 2018-09-12 DIAGNOSIS — Z87891 Personal history of nicotine dependence: Secondary | ICD-10-CM | POA: Diagnosis not present

## 2018-09-12 DIAGNOSIS — G301 Alzheimer's disease with late onset: Secondary | ICD-10-CM | POA: Diagnosis present

## 2018-09-12 DIAGNOSIS — I5032 Chronic diastolic (congestive) heart failure: Secondary | ICD-10-CM | POA: Diagnosis present

## 2018-09-12 DIAGNOSIS — I110 Hypertensive heart disease with heart failure: Secondary | ICD-10-CM | POA: Diagnosis present

## 2018-09-12 DIAGNOSIS — I5030 Unspecified diastolic (congestive) heart failure: Secondary | ICD-10-CM | POA: Diagnosis not present

## 2018-09-12 LAB — COMPREHENSIVE METABOLIC PANEL
ALT: 19 U/L (ref 0–44)
AST: 25 U/L (ref 15–41)
Albumin: 4.3 g/dL (ref 3.5–5.0)
Alkaline Phosphatase: 82 U/L (ref 38–126)
Anion gap: 9 (ref 5–15)
BILIRUBIN TOTAL: 0.7 mg/dL (ref 0.3–1.2)
BUN: 36 mg/dL — AB (ref 8–23)
CO2: 28 mmol/L (ref 22–32)
Calcium: 9.1 mg/dL (ref 8.9–10.3)
Chloride: 101 mmol/L (ref 98–111)
Creatinine, Ser: 0.78 mg/dL (ref 0.61–1.24)
GFR calc Af Amer: >60 mL/min (ref >60)
Glucose, Bld: 144 mg/dL — ABNORMAL HIGH (ref 70–99)
POTASSIUM: 3.9 mmol/L (ref 3.5–5.1)
Sodium: 138 mmol/L (ref 135–145)
TOTAL PROTEIN: 7 g/dL (ref 6.5–8.1)

## 2018-09-12 LAB — URINALYSIS, COMPLETE (UACMP) WITH MICROSCOPIC
BILIRUBIN URINE: NEGATIVE
Bacteria, UA: NONE SEEN
GLUCOSE, UA: NEGATIVE mg/dL
HGB URINE DIPSTICK: NEGATIVE
Ketones, ur: 5 mg/dL — AB
LEUKOCYTES UA: NEGATIVE
NITRITE: NEGATIVE
PH: 5 (ref 5.0–8.0)
Protein, ur: 30 mg/dL — AB
SPECIFIC GRAVITY, URINE: 1.024 (ref 1.005–1.030)
Squamous Epithelial / LPF: NONE SEEN (ref 0–5)

## 2018-09-12 LAB — CBC WITH DIFFERENTIAL/PLATELET
Abs Immature Granulocytes: 0.04 10*3/uL (ref 0.00–0.07)
BASOS PCT: 0 %
Basophils Absolute: 0 10*3/uL (ref 0.0–0.1)
EOS PCT: 2 %
Eosinophils Absolute: 0.2 10*3/uL (ref 0.0–0.5)
HCT: 40.5 % (ref 39.0–52.0)
Hemoglobin: 13.4 g/dL (ref 13.0–17.0)
IMMATURE GRANULOCYTES: 1 %
Lymphocytes Relative: 25 %
Lymphs Abs: 1.8 10*3/uL (ref 0.7–4.0)
MCH: 33.7 pg (ref 26.0–34.0)
MCHC: 33.1 g/dL (ref 30.0–36.0)
MCV: 101.8 fL — ABNORMAL HIGH (ref 80.0–100.0)
Monocytes Absolute: 0.9 10*3/uL (ref 0.1–1.0)
Monocytes Relative: 12 %
NEUTROS ABS: 4.4 10*3/uL (ref 1.7–7.7)
Neutrophils Relative %: 60 %
PLATELETS: 165 10*3/uL (ref 150–400)
RBC: 3.98 MIL/uL — AB (ref 4.22–5.81)
RDW: 12 % (ref 11.5–15.5)
SMEAR REVIEW: NORMAL
WBC: 7.2 10*3/uL (ref 4.0–10.5)
nRBC: 0 % (ref 0.0–0.2)

## 2018-09-12 LAB — PROCALCITONIN: Procalcitonin: 1.07 ng/mL

## 2018-09-12 LAB — INFLUENZA PANEL BY PCR (TYPE A & B)
Influenza A By PCR: NEGATIVE
Influenza B By PCR: NEGATIVE

## 2018-09-12 LAB — TROPONIN I

## 2018-09-12 LAB — PROTIME-INR
INR: 0.93
PROTHROMBIN TIME: 12.4 s (ref 11.4–15.2)

## 2018-09-12 LAB — APTT: aPTT: 31 seconds (ref 24–36)

## 2018-09-12 LAB — LACTIC ACID, PLASMA: Lactic Acid, Venous: 1.4 mmol/L (ref 0.5–1.9)

## 2018-09-12 MED ORDER — SODIUM CHLORIDE 0.9 % IV BOLUS (SEPSIS): 1000.0000 mL | Freq: Once | INTRAVENOUS | Status: DC

## 2018-09-12 MED ORDER — ACETAMINOPHEN 650 MG RE SUPP
650.0000 mg | Freq: Once | RECTAL | Status: AC
  Administered 2018-09-12: 650 mg via RECTAL
  Filled 2018-09-12: qty 1

## 2018-09-12 MED ORDER — ONDANSETRON HCL 4 MG PO TABS: 4.0000 mg | ORAL_TABLET | Freq: Four times a day (QID) | ORAL | Status: DC | PRN

## 2018-09-12 MED ORDER — CARBIDOPA-LEVODOPA ER 50-200 MG PO TBCR
0.5000 | EXTENDED_RELEASE_TABLET | Freq: Two times a day (BID) | ORAL | Status: DC
  Administered 2018-09-13 – 2018-09-14 (×3): 0.5 via ORAL
  Filled 2018-09-12 (×5): qty 0.5

## 2018-09-12 MED ORDER — ATROPINE SULFATE 1 % OP SOLN
3.0000 [drp] | Freq: Every day | OPHTHALMIC | Status: DC
  Administered 2018-09-13: 3 [drp] via SUBLINGUAL
  Filled 2018-09-12: qty 2

## 2018-09-12 MED ORDER — ONDANSETRON HCL 4 MG/2ML IJ SOLN: 4.0000 mg | Freq: Four times a day (QID) | INTRAMUSCULAR | Status: DC | PRN

## 2018-09-12 MED ORDER — SODIUM CHLORIDE 0.9 % IV BOLUS (SEPSIS)
1000.0000 mL | Freq: Once | INTRAVENOUS | Status: AC
  Administered 2018-09-12: 1000 mL via INTRAVENOUS

## 2018-09-12 MED ORDER — CLINDAMYCIN PHOSPHATE 600 MG/50ML IV SOLN
600.0000 mg | Freq: Once | INTRAVENOUS | Status: DC
  Filled 2018-09-12 (×2): qty 50

## 2018-09-12 MED ORDER — SODIUM CHLORIDE 0.9 % IV SOLN
INTRAVENOUS | Status: AC
  Administered 2018-09-12 – 2018-09-13 (×2): via INTRAVENOUS

## 2018-09-12 MED ORDER — BISACODYL 10 MG RE SUPP: 10.0000 mg | Freq: Every day | RECTAL | Status: DC | PRN

## 2018-09-12 MED ORDER — ENOXAPARIN SODIUM 40 MG/0.4ML ~~LOC~~ SOLN
40.0000 mg | SUBCUTANEOUS | Status: DC
  Administered 2018-09-12 – 2018-09-13 (×2): 40 mg via SUBCUTANEOUS
  Filled 2018-09-12 (×2): qty 0.4

## 2018-09-12 MED ORDER — CLINDAMYCIN PHOSPHATE 300 MG/50ML IV SOLN
300.0000 mg | Freq: Three times a day (TID) | INTRAVENOUS | Status: DC
  Administered 2018-09-12 – 2018-09-14 (×6): 300 mg via INTRAVENOUS
  Filled 2018-09-12 (×9): qty 50

## 2018-09-12 NOTE — Progress Notes (Signed)
CODE SEPSIS - PHARMACY COMMUNICATION  **Broad Spectrum Antibiotics should be administered within 1 hour of Sepsis diagnosis**  Time Code Sepsis Called/Page Received: 1451  Antibiotics Ordered: n/a  Time of 1st antibiotic administration: n/a  Additional action taken by pharmacy: 1533 Spoke with nurse regarding ordering and administration of antibiotics.  Nurse stated they would follow up with MD regarding placing order. Per RN note sepsis order discontinued.  If necessary, Name of Provider/Nurse Contacted: Manuela Schwartz, RN    Forrest Moron ,PharmD Clinical Pharmacist  09/12/2018  2:54 PM

## 2018-09-12 NOTE — ED Provider Notes (Signed)
[facility] Emergency Department Provider Note  ____________________________________________   I have reviewed the triage vital signs and the nursing notes. Where available I have reviewed prior notes and, if possible and indicated, outside hospital notes.    HISTORY  Chief Complaint Code Sepsis    HPI Jonathan Bean is a 84 y.o. male  History is mostly per his daughter.  Patient suffers from Alzheimer's dementia, Lewy body dementia, Parkinson's disease, and family believes that he aspirated 9 days ago and since then he has had a gradual decline in his alertness.  They state last night he seemed less alert than normal they try to give him medication he could not swallow it, and it was found in his mouth this morning.  He has had no fall or head injury.  Level 5 chart caveat; no further history available due to patient status.  According to his daughter, she sleeps in the room next to him and noticed that he was having early respirations and she feels that he aspirated again last night.  He presents today with a fever from EMS.  He is somnolent but arousable, family states this is close to his baseline.  She does have a DNR, but the daughter, who is power of attorney, states that she has the right to revoke if she so chooses.    Past Medical History:  Diagnosis Date  . Alzheimer's dementia ([place])   . Apraxia   . Hypertension   . Lewy body dementia ([place])   . Parkinson's disease [facility])     Patient Active Problem List   Diagnosis Date Noted  . 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 Acute on chronic respiratory failure with hypercapnia ([place])   . Pneumonia 04/01/2016  . Benign essential tremor 08/21/2015  . Late onset Alzheimer's disease without behavioral disturbance ([place]) 08/21/2015  . Parkinsonism ([place]) 08/16/2015  . Primary osteoarthritis of both knees 07/31/2015  . Alzheimer's disease ([place]) 02/09/2015  . Lewy body dementia without behavioral  disturbance ([place]) 02/09/2015  . Apraxia 02/09/2015  . History of CHF (congestive heart failure) 02/10/2014  . History of stroke 02/10/2014  . HTN (hypertension) 02/10/2014  . Familial tremor 10/13/2012  . Depression 06/14/2012  . Memory change 06/14/2012    History reviewed. No pertinent surgical history.  Prior to Admission medications   Medication Sig Start Date End Date Taking? Authorizing Provider  acetaminophen (TYLENOL) 325 MG tablet Take 2 tablets (650 mg total) by mouth every 4 (four) hours as needed for mild pain or fever. Patient taking differently: Take 650 mg by mouth 2 (two) times daily.  04/09/16  Yes Sudini, Alveta Heimlich, MD  Arginine 500 MG CAPS Take 1,000 mg by mouth every morning.   Yes [provider]  aspirin EC 81 MG tablet Take 81 mg by mouth daily.   Yes [provider]  atropine 1 % ophthalmic solution Place 3 drops under the tongue at bedtime.  05/14/18  Yes [provider]  carbidopa-levodopa (SINEMET CR) 50-200 MG tablet Take 0.5 tablets by mouth 2 (two) times daily.    Yes [provider]  cholecalciferol (VITAMIN D) 1000 UNITS tablet Take 1,000 Units by mouth daily.   Yes [provider]  Colostrum 500 MG CAPS Take 500 mg by mouth 2 (two) times daily.   Yes [provider]  finasteride (PROSCAR) 5 MG tablet Take 1 tablet (5 mg total) by mouth daily. 08/11/18  Yes Stoioff, Ronda Fairly, MD  Melatonin 3 MG TABS  Take 3 mg by mouth at bedtime.    Yes [provider]  Memantine HCl-Donepezil HCl (NAMZARIC) 28-10 MG CP24 Take 1 tablet by mouth at bedtime.   Yes [provider]  polyethylene glycol powder (GLYCOLAX/MIRALAX) powder Take 17 g by mouth daily.    Yes [provider]  Probiotic Product (ALIGN) 4 MG CAPS Take 4 mg by mouth daily.   Yes [provider]  tamsulosin (FLOMAX) 0.4 MG CAPS capsule Take 1 capsule (0.4 mg total) by mouth 2 (two) times daily. Patient taking differently:  Take 0.4 mg by mouth daily after supper.  12/16/17  Yes Nickie Retort, MD  vitamin B-12 (CYANOCOBALAMIN) 1000 MCG tablet Take 1,000 mcg by mouth daily.   Yes [provider]  ipratropium-albuterol (DUONEB) 0.5-2.5 (3) MG/3ML SOLN Take 3 mLs by nebulization every 6 (six) hours as needed (Shortness of breath). Patient not taking: Reported on 09/12/2018 04/09/16   Hillary Bow, MD  metoprolol succinate (TOPROL XL) 25 MG 24 hr tablet Take 1 tablet (25 mg total) by mouth daily. Patient not taking: Reported on 09/12/2018 04/09/16   Hillary Bow, MD    Allergies Citalopram and Penicillins  Family History  Problem Relation Age of Onset  . Dementia Mother     Social History Social History   Tobacco Use  . Smoking status: Former Smoker    Types: Pipe    Last attempt to quit: 11/19/1989    Years since quitting: 28.8  . Smokeless tobacco: Never Used  Substance Use Topics  . Alcohol use: Not Currently  . Drug use: Never    Review of Systems Constitutional: No fever/chills until this morning Eyes: No visual changes. ENT: No sore throat. No stiff neck no neck pain Cardiovascular: Denies chest pain. Respiratory: Denies shortness of breath. Gastrointestinal:   no vomiting.  No diarrhea.  No constipation. Genitourinary: Negative for dysuria. Musculoskeletal: Negative lower extremity swelling Skin: Negative for rash. Neurological: Negative for severe headaches, focal weakness or numbness.   ____________________________________________   PHYSICAL EXAM:  VITAL SIGNS: ED Triage Vitals  Enc Vitals Group     BP 09/12/18 1447 (!) 114/55     Pulse Rate 09/12/18 1447 96     Resp 09/12/18 1447 17     Temp 09/12/18 1447 (!) 101.3 F (38.5 C)     Temp Source 09/12/18 1447 Axillary     SpO2 09/12/18 1447 94 %     Weight 09/12/18 1506 140 lb (63.5 kg)     Height 09/12/18 1506 5\' 7"  (1.702 m)     Head Circumference --      Peak Flow --      Pain Score 09/12/18 1504 0      Pain Loc --      Pain Edu? --      Excl. in [place]? --     Constitutional: Somnolent, will react to verbal and painful stimuli, Eyes: Conjunctivae are normal Head: Atraumatic HEENT: No congestion/rhinnorhea. Mucous membranes are moist.  Oropharynx non-erythematous Neck:   Nontender with no meningismus, no masses, no stridor Cardiovascular: Normal rate, regular rhythm. Grossly normal heart sounds.  Good peripheral circulation. Respiratory: Normal respiratory effort.  No retractions.  No rhonchi appreciated on the left. Abdominal: Soft and nontender. No distention. No guarding no rebound Back:  There is no focal tenderness or step off.  there is no midline tenderness there are no lesions noted. there is no CVA tenderness No evidence of Fournier's degree Musculoskeletal: No lower extremity tenderness,  no upper extremity tenderness. No joint effusions, no DVT signs strong distal pulses no edema Neurologic: Limited exam Skin:  Skin is warm, dry and intact. No rash noted.  Early decubitus ulcer noted   ____________________________________________   LABS (all labs ordered are listed, but only abnormal results are displayed)  Labs Reviewed  COMPREHENSIVE METABOLIC PANEL - Abnormal; Notable for the following components:      Result Value   Glucose, Bld 144 (*)    BUN 36 (*)    All other components within normal limits  CBC WITH DIFFERENTIAL/PLATELET - Abnormal; Notable for the following components:   RBC 3.98 (*)    MCV 101.8 (*)    All other components within normal limits  URINALYSIS, COMPLETE (UACMP) WITH MICROSCOPIC - Abnormal; Notable for the following components:   Color, Urine YELLOW (*)    APPearance CLEAR (*)    Ketones, ur 5 (*)    Protein, ur 30 (*)    All other components within normal limits  CULTURE, BLOOD (ROUTINE X 2)  CULTURE, BLOOD (ROUTINE X 2)  URINE CULTURE  TROPONIN I  PROTIME-INR  APTT  LACTIC ACID, PLASMA  INFLUENZA PANEL BY PCR (TYPE A & B)    Pertinent  labs  results that were available during my care of the patient were reviewed by me and considered in my medical decision making (see chart for details). ____________________________________________  EKG  I personally interpreted any EKGs ordered by me or triage  ____________________________________________  RADIOLOGY  Pertinent labs & imaging results that were available during my care of the patient were reviewed by me and considered in my medical decision making (see chart for details). If possible, patient and/or family made aware of any abnormal findings.  Dg Chest Port 1 View  Result Date: 09/12/2018 CLINICAL DATA:  Patient poor historian at this time. Encounter for fever. Hx HTN, Parkinson's. Former smoker. EXAM: PORTABLE CHEST 1 VIEW COMPARISON:  04/03/2016 and 04/01/2016 FINDINGS: Shallow lung inflation. The heart is enlarged with prominent LEFT ventricular contour. There are no focal consolidations or pleural effusions. No pulmonary edema. IMPRESSION: Stable cardiomegaly.  No focal acute pulmonary abnormality. Electronically Signed   By: Nolon Nations M.D.   On: 09/12/2018 15:07   ____________________________________________    PROCEDURES  Procedure(s) performed: None  Procedures  Critical Care performed: None  ____________________________________________   INITIAL IMPRESSION / ASSESSMENT AND PLAN / ED COURSE  Pertinent labs & imaging results that were available during my care of the patient were reviewed by me and considered in my medical decision making (see chart for details).  Patient here with a fever and altered mental status over his baseline, his last neurology notes suggest he does sleep 18 hours a day sometimes and therefore it is very difficult to know exactly what baseline were aiming for here.  Patient has had difficulty tolerating p.o. at home, and is presenting with rhonchorous respirations which clear with cough as well as fever.  We will treat him  empirically with clindamycin for possible aspiration pneumonia.  No indication of acute sepsis at this time aside from his temperature he does not meet criteria.  We did give him IV fluids as he appears somewhat dehydrated and family states he has not eaten or drinking today.  We are admitting him to the hospitalist service for further evaluation per    ____________________________________________   FINAL CLINICAL IMPRESSION(S) / ED DIAGNOSES  Final diagnoses:  None      This chart was dictated using  voice recognition software.  Despite best efforts to proofread,  errors can occur which can change meaning.      Schuyler Amor, MD 09/12/18 1710

## 2018-09-12 NOTE — ED Notes (Signed)
2nd call to pharmacy - waiting for cleocin. Will resend

## 2018-09-12 NOTE — ED Triage Notes (Signed)
Possibly aspirated 9 days ago, lives at home. Decreased loc, temp,

## 2018-09-12 NOTE — ED Notes (Signed)
Sepsis discontinued per verbal dr. Burlene Arnt

## 2018-09-12 NOTE — Progress Notes (Signed)
Family Meeting Note  Advance Directive:yes  Today a meeting took place with the daughter.  Patient is unable to participate due EX:BMWUXL capacity and altered mental status  The following clinical team members were present during this meeting:MD  The following were discussed:Patient's diagnosis: , Patient's progosis: Unable to determine and Goals for treatment: partial code   Had a long discussion with daughter regarding patient and family's wishes for his care. Daughter is aware that patient has Parkinson's, Alzheimer's, and Lewy body dementia, which all affect patient's quality of life and prognosis.  Daughter states that physicians have spoken with her before about transitioning her father to hospice, however, she would like for him to remain on his Parkinson's medications because she is concerned that he will die of a seizure if he stopped taking these meds.  She states that he has a wonderful quality of life and sometimes is able to make it outdoors.  He does sleep for about 20 hours a day, but when he is awake he gives his children kisses and makes jokes.  She does not want aggressive medical interventions for him, including chest compressions and intubation.  She would like for him to be partial code I would like for him to receive IV medications and BiPAP instead.  Additional follow-up to be provided: prn  Time spent during discussion:20 minutes  Evette Doffing, MD

## 2018-09-12 NOTE — H&P (Signed)
[place] [facility] [place] NAME: Jonathan Bean    MR#:  [id number]  DATE OF BIRTH:  07-07-1926  DATE OF ADMISSION:  09/12/2018  PRIMARY CARE PHYSICIAN: Derinda Late, MD   REQUESTING/REFERRING PHYSICIAN: Charlotte Crumb, MD  CHIEF COMPLAINT:   Chief Complaint  Patient presents with  . Code Sepsis    HISTORY OF PRESENT ILLNESS:  Jonathan Bean  is a 84 y.o. male with a known history of Parkinson's disease, Lewy body dementia, Alzheimer's dementia, and hypertension he was brought to the emergency department by his daughter due to concern for aspiration.  Patient is sleeping throughout the entire exam, so history is provided solely by the daughter.  Per daughter, he started "gurgling" yesterday evening.  He lives with his daughter and she sleeps in a twin bed next to his hospital bed.  She noted that he was gurgling all night.  On 10/15, his caregiver was feeding him when he was noted to choke on his food.  Daughter is wondering if he aspirated at that time.  Patient has told daughter that he is short of breath, but he has not looked like he is breathing fast or working hard to breathe.  At baseline he requires full assistance with eating, dressing, toileting, and bathing.  He sleeps about 18 to 20 hours/day and uses a wheelchair to get around the home.  He has full-time care at home.  In the ED, labs were unremarkable.  Chest x-ray without any acute abnormalities.  Flu test was negative.  He was started on clindamycin for presumed aspiration pneumonia (patient has an anaphylaxis allergy to penicillin).  Hospitalists were called for admission.  PAST MEDICAL HISTORY:   Past Medical History:  Diagnosis Date  . Alzheimer's dementia ([place])   . Apraxia   . Hypertension   . Lewy body dementia ([place])   . Parkinson's disease ([place])     PAST SURGICAL HISTORY:  History reviewed. No pertinent surgical history.  SOCIAL HISTORY:   Social History    Tobacco Use  . Smoking status: Former Smoker    Types: Pipe    Last attempt to quit: 11/19/1989    Years since quitting: 28.8  . Smokeless tobacco: Never Used  Substance Use Topics  . Alcohol use: Not Currently    FAMILY HISTORY:   Family History  Problem Relation Age of Onset  . Dementia Mother     DRUG ALLERGIES:   Allergies  Allergen Reactions  . Citalopram Hives  . Penicillins Swelling and Other (See Comments)    Daughter states deadly Has patient had a PCN reaction causing immediate rash, facial/tongue/throat swelling, SOB or lightheadedness with hypotension: Yes Has patient had a PCN reaction causing severe rash involving mucus membranes or skin necrosis: Unknown Has patient had a PCN reaction that required hospitalization: Unknown Has patient had a PCN reaction occurring within the last 10 years: Unknown If all of the above answers are "NO", then may proceed with Cephalosporin use.     REVIEW OF SYSTEMS:   ROS-unable to obtain due to patient sleeping  MEDICATIONS AT HOME:   Prior to Admission medications   Medication Sig Start Date End Date Taking? Authorizing Provider  acetaminophen (TYLENOL) 325 MG tablet Take 2 tablets (650 mg total) by mouth every 4 (four) hours as needed for mild pain or fever. Patient taking differently: Take 650 mg by mouth 2 (two) times daily.  04/09/16  Yes Hillary Bow, MD  Arginine 500 MG  CAPS Take 1,000 mg by mouth every morning.   Yes [provider]  aspirin EC 81 MG tablet Take 81 mg by mouth daily.   Yes [provider]  atropine 1 % ophthalmic solution Place 3 drops under the tongue at bedtime.  05/14/18  Yes [provider]  carbidopa-levodopa (SINEMET CR) 50-200 MG tablet Take 0.5 tablets by mouth 2 (two) times daily.    Yes [provider]  cholecalciferol (VITAMIN D) 1000 UNITS tablet Take 1,000 Units by mouth daily.   Yes [provider]  Colostrum 500 MG CAPS Take 500 mg by  mouth 2 (two) times daily.   Yes [provider]  finasteride (PROSCAR) 5 MG tablet Take 1 tablet (5 mg total) by mouth daily. 08/11/18  Yes Stoioff, Ronda Fairly, MD  Melatonin 3 MG TABS Take 3 mg by mouth at bedtime.    Yes [provider]  Memantine HCl-Donepezil HCl (NAMZARIC) 28-10 MG CP24 Take 1 tablet by mouth at bedtime.   Yes [provider]  polyethylene glycol powder (GLYCOLAX/MIRALAX) powder Take 17 g by mouth daily.    Yes [provider]  Probiotic Product (ALIGN) 4 MG CAPS Take 4 mg by mouth daily.   Yes [provider]  tamsulosin (FLOMAX) 0.4 MG CAPS capsule Take 1 capsule (0.4 mg total) by mouth 2 (two) times daily. Patient taking differently: Take 0.4 mg by mouth daily after supper.  12/16/17  Yes Nickie Retort, MD  vitamin B-12 (CYANOCOBALAMIN) 1000 MCG tablet Take 1,000 mcg by mouth daily.   Yes [provider]  ipratropium-albuterol (DUONEB) 0.5-2.5 (3) MG/3ML SOLN Take 3 mLs by nebulization every 6 (six) hours as needed (Shortness of breath). Patient not taking: Reported on 09/12/2018 04/09/16   Hillary Bow, MD  metoprolol succinate (TOPROL XL) 25 MG 24 hr tablet Take 1 tablet (25 mg total) by mouth daily. Patient not taking: Reported on 09/12/2018 04/09/16   Hillary Bow, MD      VITAL SIGNS:  Blood pressure (!) 106/51, pulse 73, temperature (!) 101.3 F (38.5 C), temperature source Axillary, resp. rate 16, height 5\' 7"  (1.702 m), weight 63.5 kg, SpO2 92 %.  PHYSICAL EXAMINATION:  Physical Exam  GENERAL:  84 y.o.-year-old thin-appearing patient lying in the bed with no acute distress.  EYES: Pupils equal, round, reactive to light and accommodation. No scleral icterus. Extraocular muscles intact.  HEENT: Head atraumatic, normocephalic. Oropharynx and nasopharynx clear.  Moist mucous membranes NECK:  Supple, no jugular venous distention. No thyroid enlargement, no tenderness.  LUNGS: Gurgling noted with  respirations, diffuse rhonchi throughout all lung fields, normal breath sounds bilaterally, no wheezing, rales, or crepitation. No use of accessory muscles of respiration.  CARDIOVASCULAR: S1, S2 normal. No murmurs, rubs, or gallops.  ABDOMEN: Soft, nontender, nondistended. Bowel sounds present. No organomegaly or mass.  EXTREMITIES: No pedal edema, cyanosis, or clubbing.  NEUROLOGIC: Unable to assess PSYCHIATRIC: Unable to assess SKIN: No obvious rash, lesion, or ulcer.   LABORATORY PANEL:   CBC Recent Labs  Lab 09/12/18 1453  WBC 7.2  HGB 13.4  HCT 40.5  PLT 165   ------------------------------------------------------------------------------------------------------------------  Chemistries  Recent Labs  Lab 09/12/18 1453  NA 138  K 3.9  CL 101  CO2 28  GLUCOSE 144*  BUN 36*  CREATININE 0.78  CALCIUM 9.1  AST 25  ALT 19  ALKPHOS 82  BILITOT 0.7   ------------------------------------------------------------------------------------------------------------------  Cardiac Enzymes Recent Labs  Lab 09/12/18 1453  TROPONINI <0.03   ------------------------------------------------------------------------------------------------------------------  RADIOLOGY:  Dg Chest Port 1 View  Result Date: 09/12/2018 CLINICAL DATA:  Patient poor historian at this time. Encounter for fever. Hx HTN, Parkinson's. Former smoker. EXAM: PORTABLE CHEST 1 VIEW COMPARISON:  04/03/2016 and 04/01/2016 FINDINGS: Shallow lung inflation. The heart is enlarged with prominent LEFT ventricular contour. There are no focal consolidations or pleural effusions. No pulmonary edema. IMPRESSION: Stable cardiomegaly.  No focal acute pulmonary abnormality. Electronically Signed   By: Nolon Nations M.D.   On: 09/12/2018 15:07      IMPRESSION AND PLAN:   Acute hypoxic respiratory failure- likely secondary to aspiration pneumonia versus pneumonitis.  Not meeting sepsis criteria.  White blood cell count  normal.  No infiltrates appreciated on chest x-ray. -Recheck chest x-ray in the morning -Check procalcitonin -Continue clindamycin IV for presumed aspiration pneumonia -Duonebs every 6 hours -Follow-up blood and urine cultures -NPO for now -Gentle IV fluids -SLP consult -Daughter declined palliative care consult  Chronic diastolic congestive heart failure- stable, does not appear volume overloaded. -ECHO  BPH- stable -Hold finasteride and Flomax while NPO  Parkinson's disease- requires total care at home -Continue Sinemet  Lewy body dementia/Alzheimer's dementia- stable -Hold memantine-donepezil while NPO   All the records are reviewed and case discussed with ED provider. Management plans discussed with the patient, family and they are in agreement.  CODE STATUS: partial  TOTAL TIME TAKING CARE OF THIS PATIENT: 60 minutes.    Berna Spare Mayo M.D on 09/12/2018 at 6:55 PM  Between 7am to 6pm - Pager - [phone]  After 6pm go to [email or web address] - Proofreader  [facility]  Office  [phone]  CC: Primary care physician; Derinda Late, MD   Note: This dictation was prepared with Dragon dictation along with smaller phrase technology. Any transcriptional errors that result from this process are unintentional.

## 2018-09-13 ENCOUNTER — Inpatient Hospital Stay: Payer: Medicare PPO

## 2018-09-13 ENCOUNTER — Inpatient Hospital Stay (HOSPITAL_COMMUNITY)
Admit: 2018-09-13 | Discharge: 2018-09-13 | Disposition: A | Discharge status: Home / Self Care | Payer: Medicare PPO | Attending: Internal Medicine | Admitting: Internal Medicine

## 2018-09-13 DIAGNOSIS — I5030 Unspecified diastolic (congestive) heart failure: Secondary | ICD-10-CM

## 2018-09-13 DIAGNOSIS — L8990 Pressure ulcer of unspecified site, unspecified stage: Secondary | ICD-10-CM

## 2018-09-13 LAB — CBC
HEMATOCRIT: 34.7 % — AB (ref 39.0–52.0)
HEMOGLOBIN: 11.5 g/dL — AB (ref 13.0–17.0)
MCH: 33.9 pg (ref 26.0–34.0)
MCHC: 33.1 g/dL (ref 30.0–36.0)
MCV: 102.4 fL — AB (ref 80.0–100.0)
Platelets: 139 10*3/uL — ABNORMAL LOW (ref 150–400)
RBC: 3.39 MIL/uL — ABNORMAL LOW (ref 4.22–5.81)
RDW: 11.9 % (ref 11.5–15.5)
WBC: 8.4 10*3/uL (ref 4.0–10.5)
nRBC: 0 % (ref 0.0–0.2)

## 2018-09-13 LAB — BASIC METABOLIC PANEL
Anion gap: 6 (ref 5–15)
BUN: 33 mg/dL — AB (ref 8–23)
CHLORIDE: 106 mmol/L (ref 98–111)
CO2: 28 mmol/L (ref 22–32)
CREATININE: 0.59 mg/dL — AB (ref 0.61–1.24)
Calcium: 8.3 mg/dL — ABNORMAL LOW (ref 8.9–10.3)
GFR calc Af Amer: >60 mL/min (ref >60)
GFR calc non Af Amer: >60 mL/min (ref >60)
GLUCOSE: 120 mg/dL — AB (ref 70–99)
Potassium: 3.6 mmol/L (ref 3.5–5.1)
SODIUM: 140 mmol/L (ref 135–145)

## 2018-09-13 LAB — ECHOCARDIOGRAM COMPLETE
Height: 67 in
Weight: 2240 oz

## 2018-09-13 MED ORDER — POLYETHYLENE GLYCOL 3350 17 GM/SCOOP PO POWD
17.0000 g | Freq: Every day | ORAL | Status: DC
  Filled 2018-09-13: qty 255

## 2018-09-13 MED ORDER — SODIUM CHLORIDE 0.9 % IV SOLN: INTRAVENOUS | Status: AC

## 2018-09-13 MED ORDER — MEMANTINE HCL-DONEPEZIL HCL ER 28-10 MG PO CP24: 1.0000 | ORAL_CAPSULE | Freq: Every day | ORAL | Status: DC

## 2018-09-13 MED ORDER — DONEPEZIL HCL 5 MG PO TABS
10.0000 mg | ORAL_TABLET | Freq: Every day | ORAL | Status: DC
  Administered 2018-09-13: 10 mg via ORAL
  Filled 2018-09-13 (×2): qty 2

## 2018-09-13 MED ORDER — MELATONIN 5 MG PO TABS
5.0000 mg | ORAL_TABLET | Freq: Every day | ORAL | Status: DC
  Administered 2018-09-13: 5 mg via ORAL
  Filled 2018-09-13 (×2): qty 1

## 2018-09-13 MED ORDER — POLYETHYLENE GLYCOL 3350 17 G PO PACK
17.0000 g | PACK | Freq: Every day | ORAL | Status: DC
  Administered 2018-09-14: 17 g via ORAL
  Filled 2018-09-13: qty 1

## 2018-09-13 MED ORDER — COLOSTRUM 500 MG PO CAPS: 500.0000 mg | ORAL_CAPSULE | Freq: Two times a day (BID) | ORAL | Status: DC

## 2018-09-13 MED ORDER — GUAIFENESIN ER 600 MG PO TB12: 600.0000 mg | ORAL_TABLET | Freq: Two times a day (BID) | ORAL | Status: DC | PRN

## 2018-09-13 MED ORDER — TAMSULOSIN HCL 0.4 MG PO CAPS
0.4000 mg | ORAL_CAPSULE | Freq: Every day | ORAL | Status: DC
  Administered 2018-09-13: 0.4 mg via ORAL
  Filled 2018-09-13: qty 1

## 2018-09-13 MED ORDER — ARGININE 500 MG PO CAPS: 1000.0000 mg | ORAL_CAPSULE | ORAL | Status: DC

## 2018-09-13 MED ORDER — MELATONIN 3 MG PO TABS: 3.0000 mg | ORAL_TABLET | Freq: Every day | ORAL | Status: DC

## 2018-09-13 MED ORDER — MEMANTINE HCL ER 28 MG PO CP24
28.0000 mg | ORAL_CAPSULE | Freq: Every day | ORAL | Status: DC
  Administered 2018-09-13: 28 mg via ORAL
  Filled 2018-09-13 (×2): qty 1

## 2018-09-13 MED ORDER — FINASTERIDE 5 MG PO TABS
5.0000 mg | ORAL_TABLET | Freq: Every day | ORAL | Status: DC
  Administered 2018-09-13 – 2018-09-14 (×2): 5 mg via ORAL
  Filled 2018-09-13 (×2): qty 1

## 2018-09-13 NOTE — Progress Notes (Signed)
   09/13/18 0945  Clinical Encounter Type  Visited With Patient and family together  Visit Type Spiritual support  Referral From Nurse  Consult/Referral To Chaplain  Spiritual Encounters  Spiritual Needs Prayer;Emotional;Grief support  CH entered room and saw patient sleeping on hospital bed upon arrival. Daughter was seated next to patient. Patient wakes up to voice. Seemed sleepy throughout visit. [place] spent a few minutes building rapport with daughter. Patient has strong support system in community. Desired prayer for father. [facility] provided pastoral care through prayer, building rapport and being a non-anxious presence. Follow up visits are desired.

## 2018-09-13 NOTE — Progress Notes (Signed)
Order received from Dr Benjie Karvonen to order sips with meds so meds can be given.  Daughter of the patient is requesting that the patient get his namzaric while here.  A message was sent to Dr Benjie Karvonen

## 2018-09-13 NOTE — Progress Notes (Signed)
*  PRELIMINARY RESULTS* Echocardiogram 2D Echocardiogram has been performed.  Jonathan Bean 09/13/2018, 3:52 PM

## 2018-09-13 NOTE — Progress Notes (Signed)
PHARMACIST - PHYSICIAN ORDER COMMUNICATION  CONCERNING: P&T Medication Policy on Herbal Medications  DESCRIPTION:  This patient's order for:  Arginine and colostrum  has been noted.  This product(s) is classified as an "herbal" or natural product. Due to a lack of definitive safety studies or FDA approval, nonstandard manufacturing practices, plus the potential risk of unknown drug-drug interactions while on inpatient medications, the Pharmacy and Therapeutics Committee does not permit the use of "herbal" or natural products of this type within [facility].   ACTION TAKEN: The pharmacy department is unable to verify this order at this time. Please reevaluate patient's clinical condition at discharge and address if the herbal or natural product(s) should be resumed at that time.   Evelena Asa, PharmD 09/13/18 10:23

## 2018-09-13 NOTE — Progress Notes (Signed)
IVF expired.  Dr Brett Albino gave order to renew IVF

## 2018-09-13 NOTE — Plan of Care (Signed)
  Problem: Safety: Goal: Ability to remain free from injury will improve Outcome: Progressing   

## 2018-09-13 NOTE — Progress Notes (Signed)
[facility] NAME: Jonathan Bean    MR#:  [id number]  DATE OF BIRTH:  August 12, 1926  SUBJECTIVE:   patient sleeping  Daughters at bedside Patient is total care Daughter was worried that patient may be aspirating he has had more secretions over the past several weeks  She is not interested in hospice or palliative care services  REVIEW OF SYSTEMS:  Unable to obtain   Tolerating Diet: N.p.o.      DRUG ALLERGIES:   Allergies  Allergen Reactions  . Citalopram Hives  . Penicillins Swelling and Other (See Comments)    Daughter states deadly Has patient had a PCN reaction causing immediate rash, facial/tongue/throat swelling, SOB or lightheadedness with hypotension: Yes Has patient had a PCN reaction causing severe rash involving mucus membranes or skin necrosis: Unknown Has patient had a PCN reaction that required hospitalization: Unknown Has patient had a PCN reaction occurring within the last 10 years: Unknown If all of the above answers are "NO", then may proceed with Cephalosporin use.     VITALS:  Blood pressure (!) 121/45, pulse 68, temperature 98.2 F (36.8 C), temperature source Oral, resp. rate 20, height 5\' 7"  (1.702 m), weight 63.5 kg, SpO2 100 %.  PHYSICAL EXAMINATION:  Constitutional: Appears chronically ill-appearing not in distress  hENT: Normocephalic. Marland Kitchen Oropharynx is clear and moist.  Eyes: Conjunctivae and EOM are normal.  Sclera icterus pupils are sluggish Neck: Normal ROM. Neck supple. No JVD. No tracheal deviation. CVS: RRR, S1/S2 +, no murmurs, no gallops, no carotid bruit.  Pulmonary: Normal respiratory effort with increased secretions heard at the neck but not in the lungs no wheezing or rhonchi heard lower lung field Abdominal: Soft. BS +,  no distension, tenderness, rebound or guarding.  Musculoskeletal: Very minimal edema and no tenderness.  Neuro: Sleeping  skin: Skin is warm and dry. No rash  noted. Psychiatric: Sleeping    LABORATORY PANEL:   CBC Recent Labs  Lab 09/13/18 0430  WBC 8.4  HGB 11.5*  HCT 34.7*  PLT 139*   ------------------------------------------------------------------------------------------------------------------  Chemistries  Recent Labs  Lab 09/12/18 1453 09/13/18 0430  NA 138 140  K 3.9 3.6  CL 101 106  CO2 28 28  GLUCOSE 144* 120*  BUN 36* 33*  CREATININE 0.78 0.59*  CALCIUM 9.1 8.3*  AST 25  --   ALT 19  --   ALKPHOS 82  --   BILITOT 0.7  --    ------------------------------------------------------------------------------------------------------------------  Cardiac Enzymes Recent Labs  Lab 09/12/18 1453  TROPONINI <0.03   ------------------------------------------------------------------------------------------------------------------  RADIOLOGY:  Dg Chest 1 View  Result Date: 09/13/2018 CLINICAL DATA:  Hypoxia EXAM: CHEST  1 VIEW COMPARISON:  09/12/2018 FINDINGS: Mild retrocardiac opacity, possibly atelectasis, pneumonia not excluded. Right lung is clear. No pleural effusion or pneumothorax. The heart is normal in size. Degenerative changes of the visualized thoracolumbar spine. IMPRESSION: Mild retrocardiac opacity, possibly atelectasis, pneumonia not excluded. Electronically Signed   By: Julian Hy M.D.   On: 09/13/2018 09:01   Dg Chest Port 1 View  Result Date: 09/12/2018 CLINICAL DATA:  Patient poor historian at this time. Encounter for fever. Hx HTN, Parkinson's. Former smoker. EXAM: PORTABLE CHEST 1 VIEW COMPARISON:  04/03/2016 and 04/01/2016 FINDINGS: Shallow lung inflation. The heart is enlarged with prominent LEFT ventricular contour. There are no focal consolidations or pleural effusions. No pulmonary edema. IMPRESSION: Stable cardiomegaly.  No focal acute pulmonary abnormality. Electronically Signed   By: Benjamine Mola  Owens Shark M.D.   On: 09/12/2018 15:07     ASSESSMENT AND PLAN:   84 year old male with  history of Lewy body dementia, chronic diastolic heart failure with reduced ejection fraction, Parkinson's disease and BPH who presents to the emergency room due to concern for aspiration by the daughter.  1.  Acute hypoxic respiratory failure due to aspiration pneumonia Continue clindamycin N.p.o. until swallow evaluation Strict aspiration precautions  2.  Chronic diastolic heart failure last echocardiogram 2014 Admitting MD ordered echocardiogram which needs to be followed up on Patient does not have any signs of exacerbation  3.  Parkinson's disease: Continue Sinemet and atropine 4. Dementia: Continue Namenda/Aricept  5.  BPH: Continue Proscar and Flomax   Patient's daughter does not want hospice or palliative care services at this time   Management plans discussed with the patient's daughter and she is in agreement.  CODE STATUS: Partial  TOTAL TIME TAKING CARE OF THIS PATIENT: 31 minutes.     POSSIBLE D/C tomorrow to home, DEPENDING ON CLINICAL CONDITION.   Reyden Smith M.D on 09/13/2018 at 10:00 AM  Between 7am to 6pm - Pager - [phone] After 6pm go to [email or web address] - password [email or web address] [facility]  Office  [phone]  CC: Primary care physician; Derinda Late, MD  Note: This dictation was prepared with Dragon dictation along with smaller phrase technology. Any transcriptional errors that result from this process are unintentional.

## 2018-09-14 LAB — URINE CULTURE

## 2018-09-14 LAB — PROCALCITONIN: PROCALCITONIN: 0.4 ng/mL

## 2018-09-14 MED ORDER — TAMSULOSIN HCL 0.4 MG PO CAPS: 0.4000 mg | ORAL_CAPSULE | Freq: Every day | ORAL | 0 refills | Status: AC

## 2018-09-14 MED ORDER — CLINDAMYCIN PALMITATE HCL 75 MG/5ML PO SOLR: 300.0000 mg | Freq: Three times a day (TID) | ORAL | 0 refills | Status: AC

## 2018-09-14 NOTE — Progress Notes (Signed)
Nsg Discharge Note  Admit Date:  09/12/2018 Discharge date: 09/14/2018   Su Hilt to be D/C'd Home per MD order.  AVS completed.  Copy for chart, and copy for patient signed, and dated. Patient/caregiver able to verbalize understanding.  Discharge Medication: Allergies as of 09/14/2018      Reactions   Citalopram Hives   Penicillins Swelling, Other (See Comments)   Daughter states deadly Has patient had a PCN reaction causing immediate rash, facial/tongue/throat swelling, SOB or lightheadedness with hypotension: Yes Has patient had a PCN reaction causing severe rash involving mucus membranes or skin necrosis: Unknown Has patient had a PCN reaction that required hospitalization: Unknown Has patient had a PCN reaction occurring within the last 10 years: Unknown If all of the above answers are "NO", then may proceed with Cephalosporin use.      Medication List    STOP taking these medications   metoprolol succinate 25 MG 24 hr tablet Commonly known as:  TOPROL-XL     TAKE these medications   acetaminophen 325 MG tablet Commonly known as:  TYLENOL Take 2 tablets (650 mg total) by mouth every 4 (four) hours as needed for mild pain or fever. What changed:  when to take this   ALIGN 4 MG Caps Take 4 mg by mouth daily.   Arginine 500 MG Caps Take 1,000 mg by mouth every morning.   aspirin EC 81 MG tablet Take 81 mg by mouth daily.   atropine 1 % ophthalmic solution Place 3 drops under the tongue at bedtime.   carbidopa-levodopa 50-200 MG tablet Commonly known as:  SINEMET CR Take 0.5 tablets by mouth 2 (two) times daily.   cholecalciferol 1000 units tablet Commonly known as:  VITAMIN D Take 1,000 Units by mouth daily.   clindamycin 75 MG/5ML solution Commonly known as:  CLEOCIN Take 20 mLs (300 mg total) by mouth 3 (three) times daily.   Colostrum 500 MG Caps Take 500 mg by mouth 2 (two) times daily.   finasteride 5 MG tablet Commonly known as:   PROSCAR Take 1 tablet (5 mg total) by mouth daily.   ipratropium-albuterol 0.5-2.5 (3) MG/3ML Soln Commonly known as:  DUONEB Take 3 mLs by nebulization every 6 (six) hours as needed (Shortness of breath).   Melatonin 3 MG Tabs Take 3 mg by mouth at bedtime.   NAMZARIC 28-10 MG Cp24 Generic drug:  Memantine HCl-Donepezil HCl Take 1 tablet by mouth 2 (two) times daily. With breakfast and dinner   polyethylene glycol powder powder Commonly known as:  GLYCOLAX/MIRALAX Take 17 g by mouth daily.   tamsulosin 0.4 MG Caps capsule Commonly known as:  FLOMAX Take 1 capsule (0.4 mg total) by mouth daily after supper.   vitamin B-12 1000 MCG tablet Commonly known as:  CYANOCOBALAMIN Take 1,000 mcg by mouth daily.            Durable Medical Equipment  (From admission, onward)         Start     Ordered   09/14/18 1458  For home use only DME Gel overlay  Once     09/14/18 1458          Discharge Assessment: Vitals:   09/14/18 1101 09/14/18 1153  BP:  124/76  Pulse:  71  Resp:  16  Temp:  98.2 F (36.8 C)  SpO2: 92% 96%   Skin clean, dry and intact without evidence of skin break down, no evidence of skin tears noted. IV catheter  discontinued intact. Site without signs and symptoms of complications - no redness or edema noted at insertion site, patient denies c/o pain - only slight tenderness at site.  Dressing with slight pressure applied.  D/c Instructions-Education: Discharge instructions given to patient/family with verbalized understanding. D/c education completed with patient/family including follow up instructions, medication list, d/c activities limitations if indicated, with other d/c instructions as indicated by MD - patient able to verbalize understanding, all questions fully answered. Patient instructed to return to ED, call 911, or call MD for any changes in condition.  Patient escorted via [place], and D/C home via private auto.  Eda Keys, RN 09/14/2018  3:40 PM

## 2018-09-14 NOTE — Discharge Instructions (Signed)
It was a pleasure meeting you during this hospitalization!  You were diagnosed with aspiration pneumonia. We treated you with an antibiotic called Clindamycin. Please 60ml three times a day. Start tonight before bedtime, and then take the Clindamycin for 4 more days.  You were seen by the speech therapist. They recommended that you eat pureed foods at home.  I have ordered home health physical therapy, occupational therapy, and speech therapy to assist at home.  We wish you the best of luck and enjoy your Monday night football! -Dr. Brett Albino

## 2018-09-14 NOTE — Evaluation (Addendum)
Clinical/Bedside Swallow Evaluation Patient Details  Name: Jonathan Bean MRN: [id number] Date of Birth: 1926-04-14  Today's Date: 09/14/2018 Time: SLP Start Time (ACUTE ONLY): 0914 SLP Stop Time (ACUTE ONLY): 1015 SLP Time Calculation (min) (ACUTE ONLY): 61 min  Past Medical History:  Past Medical History:  Diagnosis Date  . Alzheimer's dementia ([place])   . Apraxia   . Hypertension   . Lewy body dementia ([place])   . Parkinson's disease [facility])    Past Surgical History: History reviewed. No pertinent surgical history. HPI:  Per admitting H & P: Jonathan Bean  is a 84 y.o. male with a known history of Parkinson's disease, Lewy body dementia, Alzheimer's dementia, and hypertension he was brought to the emergency department by his daughter due to concern for aspiration.  Patient is sleeping throughout the entire exam, so history is provided solely by the daughter.  Per daughter, he started "gurgling" yesterday evening.  He lives with his daughter and she sleeps in a twin bed next to his hospital bed.  She noted that he was gurgling all night.  On 10/15, his caregiver was feeding him when he was noted to choke on his food.  Daughter is wondering if he aspirated at that time.  Patient has told daughter that he is short of breath, but he has not looked like he is breathing fast or working hard to breathe.  At baseline he requires full assistance with eating, dressing, toileting, and bathing.  He sleeps about 18 to 20 hours/day and uses a wheelchair to get around the home.  He has full-time care at home.   Assessment / Plan / Recommendation Clinical Impression  Patient presents with s/s of aspiration c/b immediate wet coughing ONLY when taking multiple sips thin liquid via straw. Patient tolerated SINGLE sips thin liquid well with consistent clear vocal quality and no overt s/s aspiration. Patient largely kept eyes closed throughout evaluation; however verbally responded to nearly all SLP questions  and requests. Intermittently followed commands, largely unable to complete oral mech exam. Noted decreased strength and coordination of all oral motor structures, and lingual pumping consistent with progression of Parkinson's disease and Alzheimer's dementia. Poor labial seal, unable to drink thin liquids via cup, greater success demonstrated with single sips via straw. Oral phase c/b mild oral transit delay with tsp's puree, and minimal oral residue. Patient observed to propel and largely swallow soft solids whole. Observed patient masticate 1 tsp soft solid (graham cracker moistened with applesauce) and swallow with min bilateral oral residue. Patient did not attempt to masticate any further tsp's soft solid but rather swallowed them whole.  Recommend Dysphagia I  (puree) diet with SINGLE SIPS thin liquids via PINCHED STRAW. Rec give meds whole in puree, give small bites and sips only, strict aspiration precautions, and limit distractions when feeding patient. Noted strong consistent family support. Primary caregiver of past 11 years, patient's daughter provided detailed patient history. Educated daughter (primary caregiver) on rationale and neccessity of pinching straw to limit thin liquids to SINGLE sips to improve safety of swallow and decrease risk of aspiration. Observed daughter accurately pinch straw and provide SINGLE sips thin liquid via straw. Discussed HIGH risk of aspiration with multiple sips thin liquid via straw. Nsg and daughter updated and agreed on POC. SLP to f/u with toleration of diet, reinforce swallow recommendations and offer trials of upgraded consistency if appropriate. SLP Visit Diagnosis: Dysphagia, oropharyngeal phase (R13.12)    Aspiration Risk  Moderate aspiration risk;Risk for inadequate nutrition/hydration  Diet Recommendation Dysphagia 1 (Puree);Thin liquid   Liquid Administration via: Straw;Other (Comment)(PINCH straw to limit sips to SINGLE sips ONLY) Medication  Administration: Whole meds with puree Supervision: Full supervision/cueing for compensatory strategies Compensations: Minimize environmental distractions;Slow rate;Small sips/bites Postural Changes: Seated upright at 90 degrees;Remain upright for at least 30 minutes after po intake    Other  Recommendations Oral Care Recommendations: Oral care BID   Follow up Recommendations        Frequency and Duration min 4x/week  1 week       Prognosis Prognosis for Safe Diet Advancement: Fair Barriers to Reach Goals: Cognitive deficits;Severity of deficits      Swallow Study   General Date of Onset: 09/11/18 HPI: Per admitting H & P: Jonathan Bean  is a 84 y.o. male with a known history of Parkinson's disease, Lewy body dementia, Alzheimer's dementia, and hypertension he was brought to the emergency department by his daughter due to concern for aspiration.  Patient is sleeping throughout the entire exam, so history is provided solely by the daughter.  Per daughter, he started "gurgling" yesterday evening.  He lives with his daughter and she sleeps in a twin bed next to his hospital bed.  She noted that he was gurgling all night.  On 10/15, his caregiver was feeding him when he was noted to choke on his food.  Daughter is wondering if he aspirated at that time.  Patient has told daughter that he is short of breath, but he has not looked like he is breathing fast or working hard to breathe.  At baseline he requires full assistance with eating, dressing, toileting, and bathing.  He sleeps about 18 to 20 hours/day and uses a wheelchair to get around the home.  He has full-time care at home. Type of Study: Bedside Swallow Evaluation Diet Prior to this Study: NPO Respiratory Status: Nasal cannula History of Recent Intubation: No Oral Cavity Assessment: Dry Oral Care Completed by SLP: Yes Oral Cavity - Dentition: Adequate natural dentition Self-Feeding Abilities: Total assist Patient Positioning:  Upright in bed Baseline Vocal Quality: Normal Volitional Cough: Cognitively unable to elicit Volitional Swallow: Unable to elicit    Oral/Motor/Sensory Function Overall Oral Motor/Sensory Function: Moderate impairment Facial ROM: Reduced right;Reduced left Facial Symmetry: Within Functional Limits Facial Strength: Reduced right;Reduced left Lingual ROM: Within Functional Limits Lingual Strength: Reduced Velum: Within Functional Limits Mandible: Impaired   Ice Chips Ice chips: Within functional limits Presentation: Spoon   Thin Liquid Thin Liquid: Impaired Presentation: Straw Oral Phase Impairments: Reduced labial seal;Reduced lingual movement/coordination Pharyngeal  Phase Impairments: Multiple swallows;Cough - Immediate(Immediate coughing after multiple sips thin liquid via straw) Other Comments: No s/s aspiration given single sips via straw-pinch straw to limit sips to SINGLE sips ONLY    Nectar Thick Nectar Thick Liquid: Not tested   Honey Thick Honey Thick Liquid: Not tested   Puree Puree: Impaired Presentation: Spoon Oral Phase Impairments: Reduced lingual movement/coordination Oral Phase Functional Implications: Prolonged oral transit   Solid     Solid: Impaired Presentation: Spoon Oral Phase Impairments: Reduced lingual movement/coordination;Poor awareness of bolus;Impaired mastication Oral Phase Functional Implications: Prolonged oral transit;Other (comment)(LACK of mastication, swallowed whole in puree)      Arpi Diebold , MA, CCC-SLP 09/14/2018,12:26 PM

## 2018-09-14 NOTE — Consult Note (Signed)
[facility] Nurse wound consult note Reason for Consult: pressure injuries coccyx and ankle Wound type: 2 Deep tissue pressure injuries; DTPIs noted inner left upper gluteal cleft No pressure injuries found on patient's ankles Pressure Injury POA: Yes Measurement: Medial/most distal 0.5cm x 0.5cm x 0cm Lateral/most proximal 1.5cm x 0.7cm x 0cm Wound LVX:BOZW purple, non blanchable intact skin Drainage (amount, consistency, odor) none Periwound: intact  Dressing procedure/placement/frequency: Per nurse patient to be DC today, will not add air mattress for that reason Foam dressing to the pressure injuries, explained need for turning and repositioning to CG in the room. Patient is confused but answered yes/no questions some. Prevalon boots bilaterally, patient very vunerable to skin breakdown.  Discussed POC with patient and bedside nurse.  Re consult if needed, will not follow at this time. Thanks  Nichoals Heyde [employer], RN,CWOCN, CNS, [place] [phone])

## 2018-09-14 NOTE — Discharge Summary (Signed)
[facility] NAME: Jonathan Bean    MR#:  [id number]  DATE OF BIRTH:  [birth year]  DATE OF ADMISSION:  09/12/2018   ADMITTING PHYSICIAN: Sela Hua, MD  DATE OF DISCHARGE: 09/14/18  PRIMARY CARE PHYSICIAN: Derinda Late, MD   ADMISSION DIAGNOSIS:  Acute respiratory failure with hypoxia ([place]) [J96.01] DISCHARGE DIAGNOSIS:  Active Problems:   Aspiration pneumonia ([place])   Acute respiratory failure with hypoxia ([facility])   Pressure injury of skin  SECONDARY DIAGNOSIS:   Past Medical History:  Diagnosis Date  . Alzheimer's dementia ([place])   . Apraxia   . Hypertension   . Lewy body dementia ([place])   . Parkinson's disease [facility])    HOSPITAL COURSE:   Denzal is an 84 year old male with a past medical history of Parkinson's disease, Alzheimer's dementia, Lewy body dementia, hypertension who presented to the ED with hypoxia and shortness of breath.  He was felt to have an aspiration pneumonia.  He was treated with IV clindamycin (due to penicillin allergy), and was transitioned to p.o. clindamycin on discharge for a total 7-day course.  He was evaluated by speech therapy, he recommended a dysphagia 1 diet with thin liquids.  He initially required 2 L of oxygen by nasal cannula, but was stable on room air on the day of discharge.  Hospice was discussed with patient and family, which they declined at this time.  He was discharged home with home health physical therapy, speech therapy, and occupational therapy.  DISCHARGE CONDITIONS:  Aspiration pneumonia Parkinson's disease Alzheimer's dementia Lewy body dementia Hypertension CONSULTS OBTAINED:  Speech therapy DRUG ALLERGIES:   Allergies  Allergen Reactions  . Citalopram Hives  . Penicillins Swelling and Other (See Comments)    Daughter states deadly Has patient had a PCN reaction causing immediate rash, facial/tongue/throat swelling, SOB or lightheadedness with hypotension:  Yes Has patient had a PCN reaction causing severe rash involving mucus membranes or skin necrosis: Unknown Has patient had a PCN reaction that required hospitalization: Unknown Has patient had a PCN reaction occurring within the last 10 years: Unknown If all of the above answers are "NO", then may proceed with Cephalosporin use.    DISCHARGE MEDICATIONS:   Allergies as of 09/14/2018      Reactions   Citalopram Hives   Penicillins Swelling, Other (See Comments)   Daughter states deadly Has patient had a PCN reaction causing immediate rash, facial/tongue/throat swelling, SOB or lightheadedness with hypotension: Yes Has patient had a PCN reaction causing severe rash involving mucus membranes or skin necrosis: Unknown Has patient had a PCN reaction that required hospitalization: Unknown Has patient had a PCN reaction occurring within the last 10 years: Unknown If all of the above answers are "NO", then may proceed with Cephalosporin use.      Medication List    STOP taking these medications   metoprolol succinate 25 MG 24 hr tablet Commonly known as:  TOPROL-XL     TAKE these medications   acetaminophen 325 MG tablet Commonly known as:  TYLENOL Take 2 tablets (650 mg total) by mouth every 4 (four) hours as needed for mild pain or fever. What changed:  when to take this   ALIGN 4 MG Caps Take 4 mg by mouth daily.   Arginine 500 MG Caps Take 1,000 mg by mouth every morning.   aspirin EC 81 MG tablet Take 81 mg by mouth daily.   atropine 1 % ophthalmic solution Place  3 drops under the tongue at bedtime.   carbidopa-levodopa 50-200 MG tablet Commonly known as:  SINEMET CR Take 0.5 tablets by mouth 2 (two) times daily.   cholecalciferol 1000 units tablet Commonly known as:  VITAMIN D Take 1,000 Units by mouth daily.   clindamycin 75 MG/5ML solution Commonly known as:  CLEOCIN Take 20 mLs (300 mg total) by mouth 3 (three) times daily.   Colostrum 500 MG Caps Take 500  mg by mouth 2 (two) times daily.   finasteride 5 MG tablet Commonly known as:  PROSCAR Take 1 tablet (5 mg total) by mouth daily.   ipratropium-albuterol 0.5-2.5 (3) MG/3ML Soln Commonly known as:  DUONEB Take 3 mLs by nebulization every 6 (six) hours as needed (Shortness of breath).   Melatonin 3 MG Tabs Take 3 mg by mouth at bedtime.   NAMZARIC 28-10 MG Cp24 Generic drug:  Memantine HCl-Donepezil HCl Take 1 tablet by mouth 2 (two) times daily. With breakfast and dinner   polyethylene glycol powder powder Commonly known as:  GLYCOLAX/MIRALAX Take 17 g by mouth daily.   tamsulosin 0.4 MG Caps capsule Commonly known as:  FLOMAX Take 1 capsule (0.4 mg total) by mouth daily after supper.   vitamin B-12 1000 MCG tablet Commonly known as:  CYANOCOBALAMIN Take 1,000 mcg by mouth daily.            Durable Medical Equipment  (From admission, onward)         Start     Ordered   09/14/18 1458  For home use only DME Gel overlay  Once     09/14/18 1458           DISCHARGE INSTRUCTIONS:  1.  Follow-up with PCP in 1 to 2 weeks 2.  Speech therapy recommended dysphagia 1 diet and thin liquids 3.  Patient discharged on total 7-day course of clindamycin for presumed aspiration pneumonia 4.  Home health services ordered at discharge. DIET:  SLP recommendations: Dysphagia 1 (Puree);Thin liquid  Liquid Administration via: Straw;Other (Comment)(PINCH straw to limit sips to SINGLE sips ONLY) Medication Administration: Whole meds with puree Supervision: Full supervision/cueing for compensatory strategies Compensations: Minimize environmental distractions;Slow rate;Small sips/bites Postural Changes: Seated upright at 90 degrees;Remain upright for at least 30 minutes after po intake  DISCHARGE CONDITION:  Stable ACTIVITY:  Activity as tolerated OXYGEN:  Home Oxygen: No.  Oxygen Delivery: room air DISCHARGE LOCATION:  home   If you experience worsening of your admission  symptoms, develop shortness of breath, life threatening emergency, suicidal or homicidal thoughts you must seek medical attention immediately by calling 911 or calling your MD immediately  if symptoms less severe.  You Must read complete instructions/literature along with all the possible adverse reactions/side effects for all the Medicines you take and that have been prescribed to you. Take any new Medicines after you have completely understood and accpet all the possible adverse reactions/side effects.   Please note  You were cared for by a hospitalist during your hospital stay. If you have any questions about your discharge medications or the care you received while you were in the hospital after you are discharged, you can call the unit and asked to speak with the hospitalist on call if the hospitalist that took care of you is not available. Once you are discharged, your primary care physician will handle any further medical issues. Please note that NO REFILLS for any discharge medications will be authorized once you are discharged, as it is imperative that  you return to your primary care physician (or establish a relationship with a primary care physician if you do not have one) for your aftercare needs so that they can reassess your need for medications and monitor your lab values.    On the day of Discharge:  VITAL SIGNS:  Blood pressure 124/76, pulse 71, temperature 98.2 F (36.8 C), temperature source Oral, resp. rate 16, height 5\' 7"  (1.702 m), weight 63.5 kg, SpO2 96 %. PHYSICAL EXAMINATION:  GENERAL:  84 y.o.-year-old thin-appearing patient lying in the bed with no acute distress.  EYES: Pupils equal, round, reactive to light and accommodation. No scleral icterus. Extraocular muscles intact.  HEENT: Head atraumatic, normocephalic. Oropharynx and nasopharynx clear.  NECK:  Supple, no jugular venous distention. No thyroid enlargement, no tenderness.  LUNGS: Diminished breath sounds in  the lung bases bilaterally, no wheezing, rales,rhonchi or crepitation. No use of accessory muscles of respiration.  CARDIOVASCULAR: RRR, S1, S2 normal. No murmurs, rubs, or gallops.  ABDOMEN: Soft, non-tender, non-distended. Bowel sounds present. No organomegaly or mass.  EXTREMITIES: No pedal edema, cyanosis, or clubbing.  NEUROLOGIC: Cranial nerves II through XII are intact. + Global weakness. Sensation intact. Gait not checked.  PSYCHIATRIC: The patient is alert and oriented x 3.  SKIN: No obvious rash, lesion, or ulcer.  DATA REVIEW:   CBC Recent Labs  Lab 09/13/18 0430  WBC 8.4  HGB 11.5*  HCT 34.7*  PLT 139*    Chemistries  Recent Labs  Lab 09/12/18 1453 09/13/18 0430  NA 138 140  K 3.9 3.6  CL 101 106  CO2 28 28  GLUCOSE 144* 120*  BUN 36* 33*  CREATININE 0.78 0.59*  CALCIUM 9.1 8.3*  AST 25  --   ALT 19  --   ALKPHOS 82  --   BILITOT 0.7  --      Microbiology Results  Results for orders placed or performed during the hospital encounter of 09/12/18  Blood Culture (routine x 2)     Status: None (Preliminary result)   Collection Time: 09/12/18  2:53 PM  Result Value Ref Range Status   Specimen Description BLOOD BLOOD LEFT HAND  Final   Special Requests   Final    BOTTLES DRAWN AEROBIC AND ANAEROBIC Blood Culture adequate volume   Culture   Final    NO GROWTH 2 DAYS Performed at [facility], [address], [place], [place] [address]    Report Status PENDING  Incomplete  Urine culture     Status: Abnormal   Collection Time: 09/12/18  2:53 PM  Result Value Ref Range Status   Specimen Description   Final    URINE, RANDOM Performed at [facility], [address] [place], [place] [address]    Special Requests   Final    NONE Performed at [facility], [address], [place], [place] [address]    Culture MULTIPLE SPECIES PRESENT, SUGGEST RECOLLECTION (A)  Final   Report Status 09/14/2018 FINAL  Final  Blood Culture  (routine x 2)     Status: None (Preliminary result)   Collection Time: 09/12/18  3:19 PM  Result Value Ref Range Status   Specimen Description BLOOD BLOOD RIGHT HAND  Final   Special Requests   Final    BOTTLES DRAWN AEROBIC AND ANAEROBIC Blood Culture results may not be optimal due to an inadequate volume of blood received in culture bottles   Culture   Final    NO GROWTH 2  DAYS Performed at [facility], [address] [place], [place] [address]    Report Status PENDING  Incomplete    RADIOLOGY:  No results found.   Management plans discussed with the patient, family and they are in agreement.  CODE STATUS: Partial Code   TOTAL TIME TAKING CARE OF THIS PATIENT: 45 minutes.    Berna Spare Lyndi Holbein M.D on 09/14/2018 at 3:36 PM  Between 7am to 6pm - Pager - [phone]  After 6pm go to [email or web address] - Proofreader  [facility]  Office  [phone]  CC: Primary care physician; Derinda Late, MD   Note: This dictation was prepared with Dragon dictation along with smaller phrase technology. Any transcriptional errors that result from this process are unintentional.

## 2018-09-14 NOTE — Care Management Note (Addendum)
Case Management Note  Patient Details  Name: Jonathan Bean MRN: [id number] Date of Birth: 25-Oct-1926   Patient admitted from home with Acute hypoxic respiratory failure due to aspiration pneumonia. Patient lives at home with daughter.  Assessment completed with daughter who is at bedside.  PCP [facility].  Patient has hospital bed, sit to stand, BC, and BSC in the home. Patient to discharge today with home health.  Daughter request [facility], and request [facility] for PT.  Referral made to [facility] with [facility].  Daughter inquires about new mattress for hospital bed through [facility].  Per Corene Cornea with [facility] Patient can receive a new bed and mattress every 5 years.  Patient has only had his bed for 2.5 years.  Daughter notified that if she would like a new mattress at this time it will be an out of pocket expense.  Gel overlay for mattress orders.   Auth pending for overlay.  Daughter to transport at discharge.  Daughter denies issues with transportation or with obtaining medications. Per bedside RN patient has been weaned to room air and maintaing saturations.  No further RNCM needs identified.  Subjective/Objective:                    Action/Plan:   Expected Discharge Date:  09/14/18               Expected Discharge Plan:  [place]  In-House Referral:     Discharge planning Services  CM Consult  Post Acute Care Choice:  Home Health, Durable Medical Equipment Choice offered to:  Adult Children  DME Arranged:  Gel overlay DME Agency:  [facility] Arranged:  PT, OT, Speech Therapy [place] Agency:  [facility] (now [facility])  Status of Service:  Completed, signed off  If discussed at [place] of Stay Meetings, dates discussed:    Additional Comments:  Beverly Sessions, RN 09/14/2018, 3:04 PM

## 2018-09-17 LAB — CULTURE, BLOOD (ROUTINE X 2)
CULTURE: NO GROWTH
Culture: NO GROWTH
Special Requests: ADEQUATE

## 2018-09-21 ENCOUNTER — Telehealth: Payer: Self-pay | Admitting: Pediatrics

## 2018-09-21 NOTE — Telephone Encounter (Signed)
RNCM followed with patient's daughter Jonathan Bean who is his primary caregiver in response to [facility] calls.   EMMI was flagged due to answers for Not knowing when follow up is, and wounds not healing well.  Daughter states she was the one who answered the questions, and some of her answers were misinterpreted.  Daughter states that the patient has a follow up appointment with PCP tomorrow, and with neurology on 11/6.  Daughter states that she did not answer "wounds not healing well".  Daughter states the areas on his bottom seem to be about the same, and are being followed by the home health agency.   While on the phone daughter does inquired about medical transportation.  RNCM provided daughter with information about [facility], and [facility].  Daughter thanked [facility] for information.

## 2019-05-22 IMAGING — DX DG CHEST 1V
1 series · 1 of 1 positions shown · non-contrast
Comparison: 09/12/2018

CLINICAL DATA: Hypoxia

EXAM:
CHEST  1 VIEW

[chest ap]
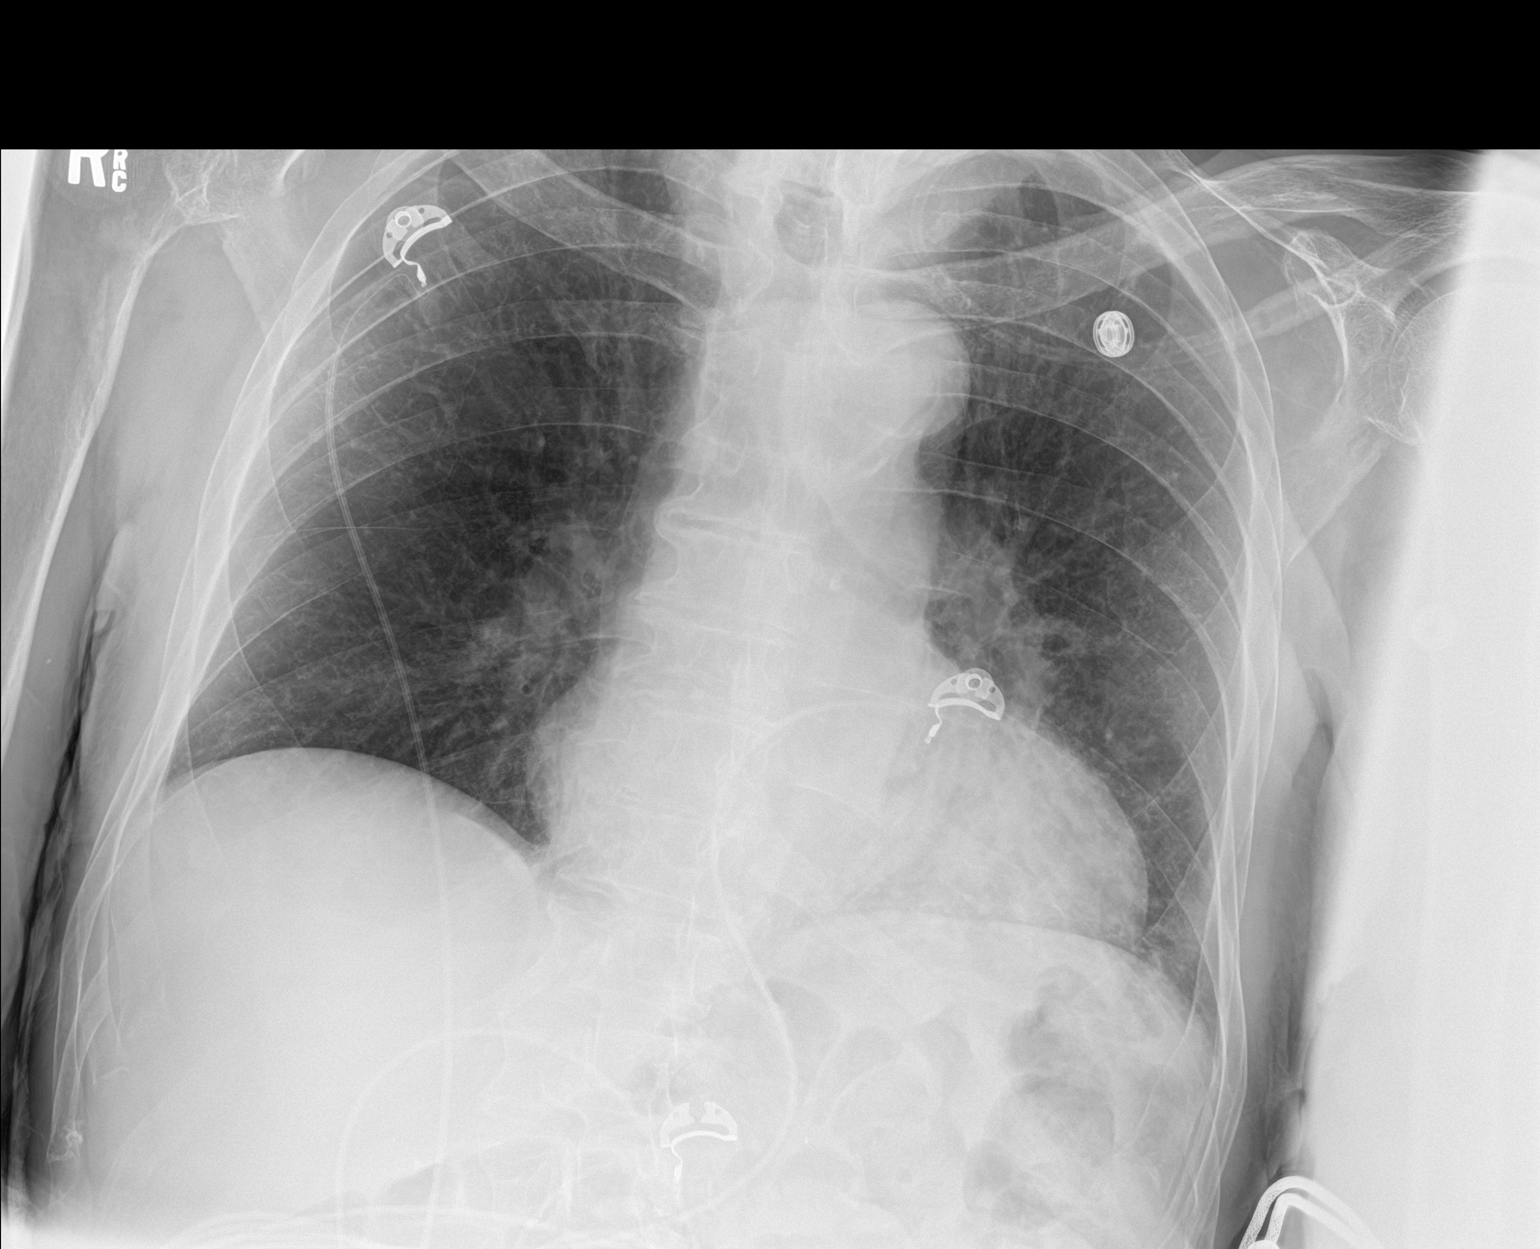

[1 of 1 positions shown; findings below may reference images not displayed]

FINDINGS: Mild retrocardiac opacity, possibly atelectasis, pneumonia not
excluded. Right lung is clear. No pleural effusion or pneumothorax.

The heart is normal in size.

Degenerative changes of the visualized thoracolumbar spine.
IMPRESSION: Mild retrocardiac opacity, possibly atelectasis, pneumonia not
excluded.

## 2019-05-28 ENCOUNTER — Other Ambulatory Visit: Payer: Medicare PPO | Admitting: Urology

## 2019-06-11 ENCOUNTER — Other Ambulatory Visit: Payer: Medicare PPO | Admitting: Urology

## 2019-06-19 DEATH — deceased
